# Patient Record
Sex: Male | Born: 1945 | Race: White | Hispanic: No | Marital: Married | Smoking: Never smoker
Health system: Southern US, Community
[De-identification: ages and names within clinical notes are randomized; demographics above are authoritative.]

## PROBLEM LIST (undated history)

## (undated) DIAGNOSIS — H269 Unspecified cataract: Secondary | ICD-10-CM

## (undated) DIAGNOSIS — M1990 Unspecified osteoarthritis, unspecified site: Secondary | ICD-10-CM

## (undated) DIAGNOSIS — K219 Gastro-esophageal reflux disease without esophagitis: Secondary | ICD-10-CM

## (undated) DIAGNOSIS — H3320 Serous retinal detachment, unspecified eye: Secondary | ICD-10-CM

## (undated) DIAGNOSIS — T7840XA Allergy, unspecified, initial encounter: Secondary | ICD-10-CM

## (undated) DIAGNOSIS — E785 Hyperlipidemia, unspecified: Secondary | ICD-10-CM

## (undated) DIAGNOSIS — H35729 Serous detachment of retinal pigment epithelium, unspecified eye: Secondary | ICD-10-CM

## (undated) DIAGNOSIS — Z9109 Other allergy status, other than to drugs and biological substances: Secondary | ICD-10-CM

## (undated) HISTORY — DX: Other allergy status, other than to drugs and biological substances: Z91.09

## (undated) HISTORY — DX: Hyperlipidemia, unspecified: E78.5

## (undated) HISTORY — PX: OTHER SURGICAL HISTORY: SHX169

## (undated) HISTORY — DX: Serous retinal detachment, unspecified eye: H33.20

## (undated) HISTORY — PX: VITRECTOMY: SHX106

## (undated) HISTORY — PX: INGUINAL HERNIA REPAIR: SUR1180

## (undated) HISTORY — DX: Serous detachment of retinal pigment epithelium, unspecified eye: H35.729

## (undated) HISTORY — DX: Unspecified cataract: H26.9

## (undated) HISTORY — PX: POLYPECTOMY: SHX149

## (undated) HISTORY — PX: COLONOSCOPY: SHX174

## (undated) HISTORY — DX: Unspecified osteoarthritis, unspecified site: M19.90

## (undated) HISTORY — DX: Allergy, unspecified, initial encounter: T78.40XA

## (undated) HISTORY — DX: Gastro-esophageal reflux disease without esophagitis: K21.9

---

## 2001-05-06 ENCOUNTER — Encounter: Payer: Self-pay | Admitting: Emergency Medicine

## 2001-05-06 ENCOUNTER — Emergency Department (HOSPITAL_COMMUNITY): Admission: EM | Admit: 2001-05-06 | Discharge: 2001-05-06 | Payer: Self-pay | Admitting: Emergency Medicine

## 2004-02-28 ENCOUNTER — Ambulatory Visit: Payer: Self-pay | Admitting: Internal Medicine

## 2004-03-05 ENCOUNTER — Ambulatory Visit: Payer: Self-pay | Admitting: Internal Medicine

## 2004-03-19 ENCOUNTER — Ambulatory Visit: Payer: Self-pay | Admitting: Internal Medicine

## 2004-04-23 ENCOUNTER — Ambulatory Visit: Payer: Self-pay | Admitting: Internal Medicine

## 2004-05-05 ENCOUNTER — Ambulatory Visit: Payer: Self-pay | Admitting: Internal Medicine

## 2004-07-14 ENCOUNTER — Ambulatory Visit: Payer: Self-pay | Admitting: Internal Medicine

## 2004-09-01 ENCOUNTER — Ambulatory Visit: Payer: Self-pay | Admitting: Internal Medicine

## 2004-09-09 ENCOUNTER — Ambulatory Visit: Payer: Self-pay | Admitting: Internal Medicine

## 2004-12-17 ENCOUNTER — Ambulatory Visit: Payer: Self-pay | Admitting: Internal Medicine

## 2005-01-12 ENCOUNTER — Ambulatory Visit: Payer: Self-pay | Admitting: Internal Medicine

## 2005-01-21 ENCOUNTER — Ambulatory Visit: Payer: Self-pay | Admitting: Internal Medicine

## 2005-02-03 ENCOUNTER — Ambulatory Visit: Payer: Self-pay | Admitting: Internal Medicine

## 2005-02-16 ENCOUNTER — Encounter (INDEPENDENT_AMBULATORY_CARE_PROVIDER_SITE_OTHER): Payer: Self-pay | Admitting: *Deleted

## 2005-02-16 ENCOUNTER — Ambulatory Visit: Payer: Self-pay | Admitting: Internal Medicine

## 2005-03-06 ENCOUNTER — Ambulatory Visit: Payer: Self-pay | Admitting: Internal Medicine

## 2005-04-09 ENCOUNTER — Ambulatory Visit: Payer: Self-pay | Admitting: Internal Medicine

## 2005-05-03 HISTORY — PX: KNEE ARTHROSCOPY: SUR90

## 2007-02-06 DIAGNOSIS — E785 Hyperlipidemia, unspecified: Secondary | ICD-10-CM

## 2007-02-06 HISTORY — DX: Hyperlipidemia, unspecified: E78.5

## 2007-05-17 ENCOUNTER — Ambulatory Visit: Payer: Self-pay | Admitting: Internal Medicine

## 2007-05-17 LAB — CONVERTED CEMR LAB
ALT: 27 units/L (ref 0–53)
AST: 21 units/L (ref 0–37)
Albumin: 4 g/dL (ref 3.5–5.2)
Alkaline Phosphatase: 44 units/L (ref 39–117)
BUN: 11 mg/dL (ref 6–23)
Basophils Absolute: 0 10*3/uL (ref 0.0–0.1)
Basophils Relative: 0.1 % (ref 0.0–1.0)
Bilirubin Urine: NEGATIVE
Bilirubin, Direct: 0.2 mg/dL (ref 0.0–0.3)
CO2: 29 meq/L (ref 19–32)
Calcium: 9.5 mg/dL (ref 8.4–10.5)
Chloride: 105 meq/L (ref 96–112)
Cholesterol: 169 mg/dL (ref 0–200)
Creatinine, Ser: 1 mg/dL (ref 0.4–1.5)
Eosinophils Absolute: 0.1 10*3/uL (ref 0.0–0.6)
Eosinophils Relative: 1.5 % (ref 0.0–5.0)
GFR calc Af Amer: 98 mL/min
GFR calc non Af Amer: 81 mL/min
Glucose, Bld: 89 mg/dL (ref 70–99)
Glucose, Urine, Semiquant: 100
HCT: 46.1 % (ref 39.0–52.0)
HDL: 45.1 mg/dL (ref >39.0)
Hemoglobin: 16 g/dL (ref 13.0–17.0)
Ketones, urine, test strip: NEGATIVE
LDL Cholesterol: 106 mg/dL — ABNORMAL HIGH (ref 0–99)
Lymphocytes Relative: 36.6 % (ref 12.0–46.0)
MCHC: 34.8 g/dL (ref 30.0–36.0)
MCV: 93.6 fL (ref 78.0–100.0)
Monocytes Absolute: 0.5 10*3/uL (ref 0.2–0.7)
Monocytes Relative: 8 % (ref 3.0–11.0)
Neutro Abs: 3.6 10*3/uL (ref 1.4–7.7)
Neutrophils Relative %: 53.8 % (ref 43.0–77.0)
Nitrite: NEGATIVE
PSA: 0.98 ng/mL (ref 0.10–4.00)
Platelets: 272 10*3/uL (ref 150–400)
Potassium: 4.2 meq/L (ref 3.5–5.1)
Protein, U semiquant: NEGATIVE
RBC: 4.93 M/uL (ref 4.22–5.81)
RDW: 12.5 % (ref 11.5–14.6)
Sodium: 140 meq/L (ref 135–145)
Specific Gravity, Urine: 1.02
TSH: 1.36 microintl units/mL (ref 0.35–5.50)
Total Bilirubin: 1 mg/dL (ref 0.3–1.2)
Total CHOL/HDL Ratio: 3.7
Total Protein: 6.6 g/dL (ref 6.0–8.3)
Triglycerides: 88 mg/dL (ref 0–149)
Urobilinogen, UA: 0.2
VLDL: 18 mg/dL (ref 0–40)
WBC Urine, dipstick: NEGATIVE
WBC: 6.6 10*3/uL (ref 4.5–10.5)
pH: 5.5

## 2007-05-24 ENCOUNTER — Ambulatory Visit: Payer: Self-pay | Admitting: Internal Medicine

## 2008-02-01 ENCOUNTER — Ambulatory Visit: Payer: Self-pay | Admitting: Internal Medicine

## 2008-04-16 ENCOUNTER — Ambulatory Visit: Payer: Self-pay | Admitting: Internal Medicine

## 2008-08-28 ENCOUNTER — Telehealth: Payer: Self-pay | Admitting: Internal Medicine

## 2008-08-29 ENCOUNTER — Ambulatory Visit: Payer: Self-pay | Admitting: Internal Medicine

## 2008-08-29 LAB — CONVERTED CEMR LAB
Cholesterol: 173 mg/dL (ref 0–200)
HDL: 48.8 mg/dL (ref >39.00)
LDL Cholesterol: 99 mg/dL (ref 0–99)
Total CHOL/HDL Ratio: 4
Triglycerides: 125 mg/dL (ref 0.0–149.0)
VLDL: 25 mg/dL (ref 0.0–40.0)

## 2008-10-07 ENCOUNTER — Ambulatory Visit: Payer: Self-pay | Admitting: Internal Medicine

## 2009-04-04 ENCOUNTER — Ambulatory Visit: Payer: Self-pay | Admitting: Internal Medicine

## 2009-04-04 LAB — CONVERTED CEMR LAB
ALT: 31 units/L (ref 0–53)
AST: 26 units/L (ref 0–37)
Albumin: 3.9 g/dL (ref 3.5–5.2)
Alkaline Phosphatase: 37 units/L — ABNORMAL LOW (ref 39–117)
BUN: 9 mg/dL (ref 6–23)
Basophils Absolute: 0 10*3/uL (ref 0.0–0.1)
Basophils Relative: 0.3 % (ref 0.0–3.0)
Bilirubin Urine: NEGATIVE
Bilirubin, Direct: 0.1 mg/dL (ref 0.0–0.3)
Blood in Urine, dipstick: NEGATIVE
CO2: 29 meq/L (ref 19–32)
Calcium: 9.1 mg/dL (ref 8.4–10.5)
Chloride: 108 meq/L (ref 96–112)
Cholesterol: 175 mg/dL (ref 0–200)
Creatinine, Ser: 1 mg/dL (ref 0.4–1.5)
Eosinophils Absolute: 0.1 10*3/uL (ref 0.0–0.7)
Eosinophils Relative: 1.2 % (ref 0.0–5.0)
GFR calc non Af Amer: 80.55 mL/min (ref >60)
Glucose, Bld: 94 mg/dL (ref 70–99)
Glucose, Urine, Semiquant: NEGATIVE
HCT: 45.4 % (ref 39.0–52.0)
HDL: 50 mg/dL (ref >39.00)
Hemoglobin: 15 g/dL (ref 13.0–17.0)
Ketones, urine, test strip: NEGATIVE
LDL Cholesterol: 108 mg/dL — ABNORMAL HIGH (ref 0–99)
Lymphocytes Relative: 34.5 % (ref 12.0–46.0)
Lymphs Abs: 2.3 10*3/uL (ref 0.7–4.0)
MCHC: 33.1 g/dL (ref 30.0–36.0)
MCV: 98 fL (ref 78.0–100.0)
Monocytes Absolute: 0.6 10*3/uL (ref 0.1–1.0)
Monocytes Relative: 9.1 % (ref 3.0–12.0)
Neutro Abs: 3.6 10*3/uL (ref 1.4–7.7)
Neutrophils Relative %: 54.9 % (ref 43.0–77.0)
Nitrite: NEGATIVE
PSA: 0.73 ng/mL (ref 0.10–4.00)
Platelets: 213 10*3/uL (ref 150.0–400.0)
Potassium: 4.2 meq/L (ref 3.5–5.1)
Protein, U semiquant: NEGATIVE
RBC: 4.63 M/uL (ref 4.22–5.81)
RDW: 12.5 % (ref 11.5–14.6)
Sodium: 143 meq/L (ref 135–145)
Specific Gravity, Urine: 1.015
TSH: 1.04 microintl units/mL (ref 0.35–5.50)
Total Bilirubin: 1 mg/dL (ref 0.3–1.2)
Total CHOL/HDL Ratio: 4
Total Protein: 6.6 g/dL (ref 6.0–8.3)
Triglycerides: 83 mg/dL (ref 0.0–149.0)
Urobilinogen, UA: 0.2
VLDL: 16.6 mg/dL (ref 0.0–40.0)
WBC Urine, dipstick: NEGATIVE
WBC: 6.6 10*3/uL (ref 4.5–10.5)
pH: 5

## 2009-04-11 ENCOUNTER — Ambulatory Visit: Payer: Self-pay | Admitting: Internal Medicine

## 2009-05-15 ENCOUNTER — Ambulatory Visit: Payer: Self-pay | Admitting: Family Medicine

## 2009-05-15 DIAGNOSIS — J209 Acute bronchitis, unspecified: Secondary | ICD-10-CM | POA: Insufficient documentation

## 2009-05-16 ENCOUNTER — Telehealth: Payer: Self-pay | Admitting: Internal Medicine

## 2010-01-02 ENCOUNTER — Encounter: Payer: Self-pay | Admitting: Internal Medicine

## 2010-06-02 NOTE — Letter (Signed)
Summary: Colonoscopy Letter  [facility]  [address] [place], [place] [address]   Phone: [phone]  Fax: [phone]      January 02, 2010 MRN: [id number]   [facility] [address] [place], [place]  [address]   Dear Mr. Brett Ross,   According to your medical record, it is time for you to schedule a Colonoscopy. The American Cancer Society recommends this procedure as a method to detect early colon cancer. Patients with a family history of colon cancer, or a personal history of colon polyps or inflammatory bowel disease are at increased risk.  This letter has been generated based on the recommendations made at the time of your procedure. If you feel that in your particular situation this may no longer apply, please contact our office.  Please call our office at [phone] to schedule this appointment or to update your records at your earliest convenience.  Thank you for cooperating with [place] to provide you with the very best care possible.   Sincerely,  Wilhemina Bonito. Marina Goodell, M.D.  [facility] [phone]

## 2010-06-02 NOTE — Progress Notes (Signed)
Summary: need alternative  Phone Note Call from Patient Call back at Home Phone [phone]   Caller: spouse- robin Summary of Call: pt was rx'd cough syrup yeasterday. he allergic to codiene. please rx something else. Call [facility] on [address]-----[phone]. Initial call taken by: Warnell Forester,  May 16, 2009 10:24 AM  Follow-up for Phone Call        can try robitussin dm Follow-up by: Birdie Sons MD,  May 16, 2009 2:51 PM  Additional Follow-up for Phone Call Additional follow up Details #1::        Phone Call Completed Additional Follow-up by: Rudy Jew, RN,  May 16, 2009 3:05 PM

## 2010-06-02 NOTE — Assessment & Plan Note (Signed)
Summary: COUGH, CONGESTION // RS   Vital Signs:  Patient profile:   65 year old male Temp:     97.6 degrees F oral BP sitting:   140 / 82  (left arm) Cuff size:   regular  Vitals Entered By: Sid Falcon LPN (May 15, 2009 9:29 AM) CC: Cough, congestion X 5 days   History of Present Illness: Acute visit. 5-6 day history of chest congestion which has been progressive. Cough productive of green to brown sputum. Night sweats and possible low-grade fever. Diffuse body aches. Moderate nasal congestion. Advil and Mucinex DM with minimal relief. Denies any sore throat, nausea, vomiting, or diarrhea. Nonsmoker.  Allergies: 1)  ! Caffeine  Past History:  Past Medical History: Last updated: 02/06/2007 Hyperlipidemia  Review of Systems      See HPI  Physical Exam  General:  Well-developed,well-nourished,in no acute distress; alert,appropriate and cooperative throughout examination Ears:  External ear exam shows no significant lesions or deformities.  Otoscopic examination reveals clear canals, tympanic membranes are intact bilaterally without bulging, retraction, inflammation or discharge. Hearing is grossly normal bilaterally. Nose:  External nasal examination shows no deformity or inflammation. Nasal mucosa are pink and moist without lesions or exudates. Mouth:  Oral mucosa and oropharynx without lesions or exudates.  Teeth in good repair. Neck:  No deformities, masses, or tenderness noted. Lungs:  Normal respiratory effort, chest expands symmetrically. Lungs are clear to auscultation, no crackles or wheezes. Heart:  Normal rate and regular rhythm. S1 and S2 normal without gallop, murmur, click, rub or other extra sounds.   Impression & Recommendations:  Problem # 1:  ACUTE BRONCHITIS (ICD-466.0)  His updated medication list for this problem includes:    Azithromycin 250 Mg Tabs (Azithromycin) .Marland Kitchen... 2 by mouth today then one by mouth once daily for 4 days  Hydrocodone-homatropine 5-1.5 Mg/53ml Syrp (Hydrocodone-homatropine) ..... One tsp by mouth q 4-6 hours as needed cough  Complete Medication List: 1)  Lipitor 10 Mg Tabs (Atorvastatin calcium) .... Take 1 tablet by mouth once a day 2)  Adult Aspirin Low Strength 81 Mg Tbdp (Aspirin) 3)  Fluticasone Propionate 50 Mcg/act Susp (Fluticasone propionate) .... One spray each nostril daily as needed 4)  Salmon Oil-1000 200 Mg Caps (Omega-3 fatty acids) .... 2 daily 5)  Centrum Silver Tabs (Multiple vitamins-minerals) .... Once daily 6)  Caltrate 600+d 600-400 Mg-unit Tabs (Calcium carbonate-vitamin d) .... Once daily 7)  Fexofenadine Hcl 60 Mg Tabs (Fexofenadine hcl) .Marland Kitchen.. 1 by mouth 2 times daily as needed for allergies 8)  Azithromycin 250 Mg Tabs (Azithromycin) .... 2 by mouth today then one by mouth once daily for 4 days 9)  Hydrocodone-homatropine 5-1.5 Mg/70ml Syrp (Hydrocodone-homatropine) .... One tsp by mouth q 4-6 hours as needed cough  Patient Instructions: 1)  Acute Bronchitis symptoms for less then 10 days are not  helped by antibiotics. Take over the counter cough medications. Call if no improvement in 5-7 days, sooner if increasing cough, fever, or new symptoms ( shortness of breath, chest pain) .  Prescriptions: HYDROCODONE-HOMATROPINE 5-1.5 MG/5ML SYRP (HYDROCODONE-HOMATROPINE) one tsp by mouth q 4-6 hours as needed cough  #120 ml x 0   Entered and Authorized by:   Evelena Peat MD   Signed by:   Evelena Peat MD on 05/15/2009   Method used:   Print then Give to Patient   RxID:   [id number] AZITHROMYCIN 250 MG TABS (AZITHROMYCIN) 2 by mouth today then one by mouth once daily for 4 days  #  6 x 0   Entered and Authorized by:   Evelena Peat MD   Signed by:   Evelena Peat MD on 05/15/2009   Method used:   Electronically to        [facility]. [phone]* (retail)       [phone]       [place], [place]  [address]       Ph:  [phone]       Fax: [phone]   RxID:   [id number]

## 2010-06-08 ENCOUNTER — Encounter: Payer: Self-pay | Admitting: Internal Medicine

## 2010-06-08 ENCOUNTER — Ambulatory Visit (INDEPENDENT_AMBULATORY_CARE_PROVIDER_SITE_OTHER): Payer: BC Managed Care – PPO | Admitting: Internal Medicine

## 2010-06-08 VITALS — BP 132/80 | HR 58 | Temp 97.7°F | Ht 66.5 in | Wt 171.0 lb

## 2010-06-08 DIAGNOSIS — Z0000 Encounter for general adult medical examination without abnormal findings: Secondary | ICD-10-CM

## 2010-06-08 DIAGNOSIS — J209 Acute bronchitis, unspecified: Secondary | ICD-10-CM

## 2010-06-08 DIAGNOSIS — R002 Palpitations: Secondary | ICD-10-CM

## 2010-06-08 NOTE — Progress Notes (Signed)
  Subjective:    Patient ID: Brett Ross, male    DOB: July 13, 1945, 65 y.o.   MRN: [id number]  HPI  5 days ago he woke up in the middle of night with unusual palpitations he felt in his chest. No CP, SOB, PND. No recurrent sxs sxs lasted for approximately three minutes.  No other significant sxs  Review of Systems  patient denies chest pain, shortness of breath, orthopnea. Denies lower extremity edema, abdominal pain, change in appetite, change in bowel movements. Patient denies rashes, musculoskeletal complaints. No other specific complaints in a complete review of systems.     Objective:   Physical Exam       well-developed well-nourished male in no acute distress. HEENT exam atraumatic, normocephalic, neck supple without jugular venous distention. Chest clear to auscultation cardiac exam S1-S2 are regular. Abdominal exam overweight with bowel sounds, soft and nontender. Extremities no edema. Neurologic exam is alert with a normal gait.   Assessment & Plan:   patient presents with palpitations. He had one episode. EKG today is unremarkable. He describes palpitations am not concerned. He doesn't have any associated symptoms. I think it would be best to following his condition. If he has recurrent symptoms or more frequent  Symptoms that might warrant further evaluation. Patient understands the workup and evaluation. All his questions were answered.

## 2010-06-09 LAB — CBC WITH DIFFERENTIAL/PLATELET
Basophils Relative: 0.4 % (ref 0.0–3.0)
Eosinophils Relative: 1.5 % (ref 0.0–5.0)
HCT: 43.8 % (ref 39.0–52.0)
Lymphs Abs: 3 10*3/uL (ref 0.7–4.0)
MCV: 95 fl (ref 78.0–100.0)
Monocytes Absolute: 0.7 10*3/uL (ref 0.1–1.0)
Monocytes Relative: 8.2 % (ref 3.0–12.0)
RBC: 4.61 Mil/uL (ref 4.22–5.81)
WBC: 8.2 10*3/uL (ref 4.5–10.5)

## 2010-06-09 LAB — TSH: TSH: 0.22 u[IU]/mL — ABNORMAL LOW (ref 0.35–5.50)

## 2010-07-15 ENCOUNTER — Other Ambulatory Visit (INDEPENDENT_AMBULATORY_CARE_PROVIDER_SITE_OTHER): Payer: BC Managed Care – PPO

## 2010-07-15 DIAGNOSIS — E039 Hypothyroidism, unspecified: Secondary | ICD-10-CM

## 2010-07-15 LAB — TSH: TSH: 1.45 u[IU]/mL (ref 0.35–5.50)

## 2010-07-15 LAB — T3, FREE: T3, Free: 2.7 pg/mL (ref 2.3–4.2)

## 2010-07-17 ENCOUNTER — Other Ambulatory Visit: Payer: Self-pay | Admitting: Internal Medicine

## 2010-07-28 ENCOUNTER — Encounter: Payer: Self-pay | Admitting: Internal Medicine

## 2011-02-03 ENCOUNTER — Encounter: Payer: Self-pay | Admitting: Internal Medicine

## 2011-02-17 ENCOUNTER — Encounter: Payer: Self-pay | Admitting: Internal Medicine

## 2011-02-17 ENCOUNTER — Ambulatory Visit (AMBULATORY_SURGERY_CENTER): Payer: BC Managed Care – PPO | Admitting: *Deleted

## 2011-02-17 VITALS — Ht 66.0 in | Wt 171.0 lb

## 2011-02-17 DIAGNOSIS — Z1211 Encounter for screening for malignant neoplasm of colon: Secondary | ICD-10-CM

## 2011-02-17 MED ORDER — PEG-KCL-NACL-NASULF-NA ASC-C 100 G PO SOLR: ORAL | Status: DC

## 2011-03-03 ENCOUNTER — Ambulatory Visit (AMBULATORY_SURGERY_CENTER): Payer: BC Managed Care – PPO | Admitting: Internal Medicine

## 2011-03-03 ENCOUNTER — Encounter: Payer: Self-pay | Admitting: Internal Medicine

## 2011-03-03 VITALS — HR 54 | Temp 97.5°F | Resp 20 | Ht 66.0 in | Wt 171.0 lb

## 2011-03-03 DIAGNOSIS — Z860100 Personal history of colon polyps, unspecified: Secondary | ICD-10-CM

## 2011-03-03 DIAGNOSIS — K5730 Diverticulosis of large intestine without perforation or abscess without bleeding: Secondary | ICD-10-CM

## 2011-03-03 DIAGNOSIS — Z1211 Encounter for screening for malignant neoplasm of colon: Secondary | ICD-10-CM

## 2011-03-03 MED ORDER — SODIUM CHLORIDE 0.9 % IV SOLN: 500.0000 mL | INTRAVENOUS | Status: DC

## 2011-03-03 NOTE — Patient Instructions (Signed)
FOLLOW YOUR DISCHARGE INSTRUCTIONS ON THE GREEN AND BLUE INSTRUCTION SHEETS.  CONTINUEYOUR MEDICATIONS.  HIGH FIBER  DIET.  NEXT COLONOSCOPY IN 10 YEARS.

## 2011-03-04 ENCOUNTER — Telehealth: Payer: Self-pay

## 2011-03-04 NOTE — Telephone Encounter (Signed)

## 2011-03-23 ENCOUNTER — Other Ambulatory Visit: Payer: Self-pay | Admitting: Internal Medicine

## 2011-05-06 ENCOUNTER — Other Ambulatory Visit: Payer: Self-pay | Admitting: Internal Medicine

## 2011-05-06 NOTE — Telephone Encounter (Signed)
Pt need new rx atorvastatin 10mg  #90 with 3 refills [facility] [phone].

## 2011-05-07 NOTE — Telephone Encounter (Signed)
Pt needs OV 

## 2011-05-07 NOTE — Telephone Encounter (Signed)
-  lmom at cell # [phone]

## 2011-05-10 NOTE — Telephone Encounter (Signed)
Pt would like to have labs in advance. Can I sch?

## 2011-05-11 NOTE — Telephone Encounter (Signed)
Pt is sch for cpx 05-27-11 815am

## 2011-05-11 NOTE — Telephone Encounter (Signed)
Schedule for cpx with labs

## 2011-05-20 ENCOUNTER — Other Ambulatory Visit: Payer: BC Managed Care – PPO

## 2011-05-24 ENCOUNTER — Other Ambulatory Visit: Payer: Self-pay | Admitting: Internal Medicine

## 2011-05-27 ENCOUNTER — Encounter: Payer: BC Managed Care – PPO | Admitting: Internal Medicine

## 2011-06-03 ENCOUNTER — Other Ambulatory Visit (INDEPENDENT_AMBULATORY_CARE_PROVIDER_SITE_OTHER): Payer: BC Managed Care – PPO

## 2011-06-03 DIAGNOSIS — Z0000 Encounter for general adult medical examination without abnormal findings: Secondary | ICD-10-CM

## 2011-06-03 LAB — HEPATIC FUNCTION PANEL
ALT: 20 U/L (ref 0–53)
AST: 21 U/L (ref 0–37)
Total Protein: 6.6 g/dL (ref 6.0–8.3)

## 2011-06-03 LAB — POCT URINALYSIS DIPSTICK
Glucose, UA: NEGATIVE
Ketones, UA: NEGATIVE
Leukocytes, UA: NEGATIVE
Spec Grav, UA: 1.02
Urobilinogen, UA: 0.2

## 2011-06-03 LAB — LIPID PANEL
Cholesterol: 159 mg/dL (ref 0–200)
LDL Cholesterol: 94 mg/dL (ref 0–99)

## 2011-06-03 LAB — PSA: PSA: 0.73 ng/mL (ref 0.10–4.00)

## 2011-06-03 LAB — CBC WITH DIFFERENTIAL/PLATELET
Basophils Relative: 0.3 % (ref 0.0–3.0)
Eosinophils Relative: 1 % (ref 0.0–5.0)
HCT: 43.3 % (ref 39.0–52.0)
Hemoglobin: 14.9 g/dL (ref 13.0–17.0)
Lymphs Abs: 2.1 10*3/uL (ref 0.7–4.0)
MCV: 95.2 fl (ref 78.0–100.0)
Monocytes Absolute: 0.5 10*3/uL (ref 0.1–1.0)
RBC: 4.55 Mil/uL (ref 4.22–5.81)
WBC: 6 10*3/uL (ref 4.5–10.5)

## 2011-06-03 LAB — TSH: TSH: 1.12 u[IU]/mL (ref 0.35–5.50)

## 2011-06-03 LAB — BASIC METABOLIC PANEL
Chloride: 105 mEq/L (ref 96–112)
Potassium: 4.3 mEq/L (ref 3.5–5.1)
Sodium: 141 mEq/L (ref 135–145)

## 2011-06-10 ENCOUNTER — Encounter: Payer: BC Managed Care – PPO | Admitting: Internal Medicine

## 2011-06-11 ENCOUNTER — Encounter: Payer: Self-pay | Admitting: Internal Medicine

## 2011-06-11 ENCOUNTER — Ambulatory Visit (INDEPENDENT_AMBULATORY_CARE_PROVIDER_SITE_OTHER): Payer: BC Managed Care – PPO | Admitting: Internal Medicine

## 2011-06-11 VITALS — BP 102/76 | HR 72 | Temp 98.1°F | Ht 66.0 in | Wt 170.0 lb

## 2011-06-11 DIAGNOSIS — Z0000 Encounter for general adult medical examination without abnormal findings: Secondary | ICD-10-CM

## 2011-06-11 DIAGNOSIS — M79609 Pain in unspecified limb: Secondary | ICD-10-CM

## 2011-06-11 DIAGNOSIS — Z23 Encounter for immunization: Secondary | ICD-10-CM

## 2011-06-11 DIAGNOSIS — M79673 Pain in unspecified foot: Secondary | ICD-10-CM

## 2011-06-11 MED ORDER — ATORVASTATIN CALCIUM 10 MG PO TABS: 10.0000 mg | ORAL_TABLET | Freq: Every day | ORAL | Status: DC

## 2011-06-11 NOTE — Progress Notes (Signed)
  Subjective:    Patient ID: KNOWLEDGE ESCANDON, male    DOB: 08/03/1945, 66 y.o.   MRN: [id number]  HPI  cpx  Bilateral heel pain--can last all day but definitely first thing in a.m. Using arch supports Occasionally notes back pain-typically with certain movements Questions about shingles immunization  Past Medical History  Diagnosis Date  . HYPERLIPIDEMIA 02/06/2007  . Environmental allergies   . Arthritis    Past Surgical History  Procedure Date  . Knee arthroscopy 2007    left    reports that he has never smoked. He does not have any smokeless tobacco history on file. He reports that he drinks about 4.2 ounces of alcohol per week. He reports that he does not use illicit drugs. family history includes Heart disease (age of onset:60) in his brother; Heart disease (age of onset:66) in his brother; Heart failure in his mother; Leukemia in his father; and Melanoma in his brother.  There is no history of Colon cancer, and Rectal cancer, and Stomach cancer, . Allergies  Allergen Reactions  . Caffeine     REACTION: tinnitis     Review of Systems  patient denies chest pain, shortness of breath, orthopnea. Denies lower extremity edema, abdominal pain, change in appetite, change in bowel movements. Patient denies rashes, musculoskeletal complaints. No other specific complaints in a complete review of systems.      Objective:   Physical Exam Well-developed male in no acute distress. HEENT exam atraumatic, normocephalic, extraocular muscles are intact. Conjunctivae are pink without exudate. Neck is supple without lymphadenopathy, thyromegaly, jugular venous distention. Chest is clear to auscultation without increased work of breathing. Cardiac exam S1-S2 are regular. The PMI is normal. No significant murmurs or gallops. Abdominal exam active bowel sounds, soft, nontender. No abdominal bruits. Extremities no clubbing cyanosis or edema. Peripheral pulses are normal without bruits. Neurologic  exam alert and oriented without any motor or sensory deficits. Rectal exam normal tone prostate normal size without masses or asymmetry.        Assessment & Plan:

## 2011-12-30 ENCOUNTER — Ambulatory Visit (INDEPENDENT_AMBULATORY_CARE_PROVIDER_SITE_OTHER): Payer: BC Managed Care – PPO | Admitting: Internal Medicine

## 2011-12-30 ENCOUNTER — Encounter: Payer: Self-pay | Admitting: Internal Medicine

## 2011-12-30 VITALS — BP 100/70 | Temp 97.8°F | Wt 171.0 lb

## 2011-12-30 DIAGNOSIS — J069 Acute upper respiratory infection, unspecified: Secondary | ICD-10-CM

## 2011-12-30 NOTE — Progress Notes (Signed)
Subjective:    Patient ID: Brett Ross, male    DOB: 11-18-1945, 66 y.o.   MRN: [id number]  HPI  66 year old patient who presents with a four-day history of illness. He describes sore throat headache postnasal drip and a general sense of unwellness. He also describes some nonspecific GI queasiness. No change in his bowel habits as far as frequency but stools have been a bit looser. No nausea or vomiting. No documented fever. Medical regimen includes the Flonase and Zyrtec.  Past Medical History  Diagnosis Date  . HYPERLIPIDEMIA 02/06/2007  . Environmental allergies   . Arthritis     History   Social History  . Marital Status: Married    Spouse Name: N/A    Number of Children: N/A  . Years of Education: N/A   Occupational History  . Not on file.   Social History Main Topics  . Smoking status: Never Smoker   . Smokeless tobacco: Not on file  . Alcohol Use: 4.2 oz/week    7 Cans of beer per week  . Drug Use: No  . Sexually Active: Not on file   Other Topics Concern  . Not on file   Social History Narrative  . No narrative on file    Past Surgical History  Procedure Date  . Knee arthroscopy 2007    left    Family History  Problem Relation Age of Onset  . Heart failure Mother   . Leukemia Father   . Colon cancer Neg Hx   . Rectal cancer Neg Hx   . Stomach cancer Neg Hx   . Melanoma Brother   . Heart disease Brother 56    mi, stent  . Heart disease Brother 44    MI-required stent    Allergies  Allergen Reactions  . Caffeine     REACTION: tinnitis    Current Outpatient Prescriptions on File Prior to Visit  Medication Sig Dispense Refill  . aspirin 81 MG tablet Take 81 mg by mouth daily.        Marland Kitchen atorvastatin (LIPITOR) 10 MG tablet Take 1 tablet (10 mg total) by mouth daily.  90 tablet  3  . Calcium Carbonate-Vit D-Min (CALTRATE 600+D PLUS) 600-400 MG-UNIT per tablet Chew 1 tablet by mouth daily.        . Multiple Vitamins-Minerals (CENTRUM SILVER  PO) Take by mouth daily.        . Omega-3 Fatty Acids (SALMON OIL-1000) 200 MG CAPS Take by mouth daily.        . fluticasone (FLONASE) 50 MCG/ACT nasal spray 2 sprays by Nasal route daily.          BP 100/70  Temp 97.8 F (36.6 C) (Oral)  Wt 171 lb (77.565 kg)       Review of Systems  Constitutional: Positive for fatigue. Negative for fever, chills and appetite change.  HENT: Positive for congestion, sore throat, rhinorrhea and postnasal drip. Negative for hearing loss, ear pain, trouble swallowing, neck stiffness, dental problem, voice change and tinnitus.   Eyes: Negative for pain, discharge and visual disturbance.  Respiratory: Negative for cough, chest tightness, wheezing and stridor.   Cardiovascular: Negative for chest pain, palpitations and leg swelling.  Gastrointestinal: Negative for nausea, vomiting, abdominal pain, diarrhea, constipation, blood in stool and abdominal distention.  Genitourinary: Negative for urgency, hematuria, flank pain, discharge, difficulty urinating and genital sores.  Musculoskeletal: Negative for myalgias, back pain, joint swelling, arthralgias and gait problem.  Skin: Negative for rash.  Neurological: Negative for dizziness, syncope, speech difficulty, weakness, numbness and headaches.  Hematological: Negative for adenopathy. Does not bruise/bleed easily.  Psychiatric/Behavioral: Negative for behavioral problems and dysphoric mood. The patient is not nervous/anxious.        Objective:   Physical Exam  Constitutional: He is oriented to person, place, and time. He appears well-developed and well-nourished.  HENT:  Head: Normocephalic.  Right Ear: External ear normal.  Left Ear: External ear normal.       Oropharynx is minimally injected Pharyngeal crowding noted  Eyes: Conjunctivae and EOM are normal.  Neck: Normal range of motion.  Cardiovascular: Normal rate and normal heart sounds.   Pulmonary/Chest: Breath sounds normal.  Abdominal:  Soft. Bowel sounds are normal. He exhibits no distension. There is no tenderness. There is no rebound.  Musculoskeletal: Normal range of motion. He exhibits no edema and no tenderness.  Lymphadenopathy:    He has no cervical adenopathy.  Neurological: He is alert and oriented to person, place, and time.  Psychiatric: He has a normal mood and affect. His behavior is normal.          Assessment & Plan:   Viral syndrome. We'll continue Zyrtec and fluticasone. We'll treat with the Naprelan rest

## 2011-12-30 NOTE — Patient Instructions (Signed)
Get plenty of rest, Drink lots of  clear liquids, and use Tylenol or Naprelan  for fever and discomfort.    Continue Zyrtec and fluticasone/Nasonex  Call or return to clinic prn if these symptoms worsen or fail to improve as anticipated.

## 2012-01-12 ENCOUNTER — Telehealth: Payer: Self-pay | Admitting: Internal Medicine

## 2012-01-12 MED ORDER — FLUTICASONE PROPIONATE 50 MCG/ACT NA SUSP: 2.0000 | Freq: Every day | NASAL | Status: DC

## 2012-01-12 NOTE — Telephone Encounter (Signed)
Rx sent to local pharmacy and mail order as requested.

## 2012-01-12 NOTE — Telephone Encounter (Signed)
Caller: [name]/Patient; Phone: [phone]; Reason for Call: Caller: [name]/Patient; Patient Name: Brett Ross; PCP: Birdie Sons (Adults only); [facility] Callback Phone Number: [phone]  Patient states he was seen in the office by Dr.  Amador Cunas 12/30/11.  States a prescription for Flonase/Nasonex was to be sent to Pharmacy.  Patient states prescription order was not received by [facility] or [facility] in [place].  RN reviewed Epic, Electronic Health Record.  RN noted documentation that patient was to continue Flonase/Nasonex, but Pharmacy order not noted.   PATIENT REQUESTS THAT PRESCRIPTION FOR FLONASE OR NASONEX BE SENT TO [facility] IN [place] AT [phone] AND ADDITIONAL REFILLS TO BE SENT TO [facility] AT FAX # [phone].  PATIENT CAN BE REACHED AT [phone].

## 2012-01-12 NOTE — Telephone Encounter (Signed)
Pt is aware.  

## 2012-01-31 ENCOUNTER — Ambulatory Visit (INDEPENDENT_AMBULATORY_CARE_PROVIDER_SITE_OTHER): Payer: BC Managed Care – PPO | Admitting: Family

## 2012-01-31 ENCOUNTER — Encounter: Payer: Self-pay | Admitting: Family

## 2012-01-31 VITALS — BP 130/70 | HR 88 | Temp 98.0°F | Wt 170.0 lb

## 2012-01-31 DIAGNOSIS — R05 Cough: Secondary | ICD-10-CM

## 2012-01-31 DIAGNOSIS — J069 Acute upper respiratory infection, unspecified: Secondary | ICD-10-CM

## 2012-01-31 MED ORDER — AMOXICILLIN-POT CLAVULANATE 875-125 MG PO TABS: 1.0000 | ORAL_TABLET | Freq: Two times a day (BID) | ORAL | Status: DC

## 2012-01-31 MED ORDER — METHYLPREDNISOLONE ACETATE 40 MG/ML IJ SUSP
80.0000 mg | Freq: Once | INTRAMUSCULAR | Status: AC
  Administered 2012-01-31: 80 mg via INTRAMUSCULAR

## 2012-01-31 NOTE — Progress Notes (Signed)
Subjective:    Patient ID: Brett Ross, male    DOB: April 26, 1946, 66 y.o.   MRN: [id number]  URI  This is a recurrent problem. The current episode started 1 to 4 weeks ago. The problem has been gradually worsening. The maximum temperature recorded prior to his arrival was 100 - 100.9 F. The fever has been present for 1 to 2 days. Associated symptoms include congestion, coughing, sinus pain, sneezing and a sore throat. He has tried antihistamine and decongestant (Nasonex) for the symptoms. The treatment provided moderate relief.      Review of Systems  Constitutional: Negative.   HENT: Positive for congestion, sore throat and sneezing.   Respiratory: Positive for cough.   Cardiovascular: Negative.   Skin: Negative.   Hematological: Negative.   Psychiatric/Behavioral: Negative.    Past Medical History  Diagnosis Date  . HYPERLIPIDEMIA 02/06/2007  . Environmental allergies   . Arthritis     History   Social History  . Marital Status: Married    Spouse Name: N/A    Number of Children: N/A  . Years of Education: N/A   Occupational History  . Not on file.   Social History Main Topics  . Smoking status: Never Smoker   . Smokeless tobacco: Not on file  . Alcohol Use: 4.2 oz/week    7 Cans of beer per week  . Drug Use: No  . Sexually Active: Not on file   Other Topics Concern  . Not on file   Social History Narrative  . No narrative on file    Past Surgical History  Procedure Date  . Knee arthroscopy 2007    left    Family History  Problem Relation Age of Onset  . Heart failure Mother   . Leukemia Father   . Colon cancer Neg Hx   . Rectal cancer Neg Hx   . Stomach cancer Neg Hx   . Melanoma Brother   . Heart disease Brother 22    mi, stent  . Heart disease Brother 96    MI-required stent    Allergies  Allergen Reactions  . Caffeine     REACTION: tinnitis    Current Outpatient Prescriptions on File Prior to Visit  Medication Sig Dispense  Refill  . aspirin 81 MG tablet Take 81 mg by mouth daily.        Marland Kitchen atorvastatin (LIPITOR) 10 MG tablet Take 1 tablet (10 mg total) by mouth daily.  90 tablet  3  . Calcium Carbonate-Vit D-Min (CALTRATE 600+D PLUS) 600-400 MG-UNIT per tablet Chew 1 tablet by mouth daily.        . fluticasone (FLONASE) 50 MCG/ACT nasal spray Place 2 sprays into the nose daily.  48 g  0  . Multiple Vitamins-Minerals (CENTRUM SILVER PO) Take by mouth daily.        . Omega-3 Fatty Acids (SALMON OIL-1000) 200 MG CAPS Take by mouth daily.         No current facility-administered medications on file prior to visit.    BP 130/70  Pulse 88  Temp 98 F (36.7 C) (Oral)  Wt 170 lb (77.111 kg)  SpO2 98%chart    Objective:   Physical Exam  Constitutional: He is oriented to person, place, and time. He appears well-developed and well-nourished.  HENT:  Right Ear: External ear normal.  Left Ear: External ear normal.  Nose: Nose normal.  Mouth/Throat: Oropharynx is clear and moist.  Neck: Normal range of motion. Neck supple.  Cardiovascular: Normal rate, regular rhythm and normal heart sounds.   Pulmonary/Chest: Effort normal and breath sounds normal.  Abdominal: Soft. Bowel sounds are normal.  Musculoskeletal: Normal range of motion.  Neurological: He is alert and oriented to person, place, and time.  Skin: Skin is warm and dry.  Psychiatric: He has a normal mood and affect.      Depo Medrol 80mg  IM x 1    Assessment & Plan:  Assessment: Acute Bronchitis, Cough  Plan: Meds as directed. Call the office if symptoms worsen or persist. Recheck as scheduled and as needed sooner.

## 2012-05-04 ENCOUNTER — Other Ambulatory Visit: Payer: Self-pay | Admitting: *Deleted

## 2012-05-04 MED ORDER — ATORVASTATIN CALCIUM 10 MG PO TABS: 10.0000 mg | ORAL_TABLET | Freq: Every day | ORAL | Status: DC

## 2012-07-21 ENCOUNTER — Telehealth: Payer: Self-pay | Admitting: Internal Medicine

## 2012-07-21 DIAGNOSIS — E785 Hyperlipidemia, unspecified: Secondary | ICD-10-CM

## 2012-07-21 MED ORDER — ATORVASTATIN CALCIUM 10 MG PO TABS: 10.0000 mg | ORAL_TABLET | Freq: Every day | ORAL | Status: DC

## 2012-07-21 NOTE — Telephone Encounter (Signed)
Pt is sch for follow on med on 09-18-12. Pt is requested #90 lipitor 10 mg [facility]

## 2012-07-21 NOTE — Telephone Encounter (Signed)
rx sent in electronically 

## 2012-09-18 ENCOUNTER — Encounter: Payer: Self-pay | Admitting: Internal Medicine

## 2012-09-18 ENCOUNTER — Ambulatory Visit (INDEPENDENT_AMBULATORY_CARE_PROVIDER_SITE_OTHER): Payer: BC Managed Care – PPO | Admitting: Internal Medicine

## 2012-09-18 VITALS — BP 108/80 | HR 64 | Temp 97.8°F | Ht 66.0 in | Wt 170.0 lb

## 2012-09-18 DIAGNOSIS — Z0000 Encounter for general adult medical examination without abnormal findings: Secondary | ICD-10-CM

## 2012-09-18 DIAGNOSIS — E785 Hyperlipidemia, unspecified: Secondary | ICD-10-CM

## 2012-09-18 DIAGNOSIS — Z23 Encounter for immunization: Secondary | ICD-10-CM

## 2012-09-18 LAB — CBC WITH DIFFERENTIAL/PLATELET
Basophils Absolute: 0 10*3/uL (ref 0.0–0.1)
Eosinophils Absolute: 0.1 10*3/uL (ref 0.0–0.7)
Lymphocytes Relative: 35.7 % (ref 12.0–46.0)
MCHC: 34.7 g/dL (ref 30.0–36.0)
MCV: 92.5 fl (ref 78.0–100.0)
Monocytes Absolute: 0.6 10*3/uL (ref 0.1–1.0)
Neutrophils Relative %: 53.8 % (ref 43.0–77.0)
Platelets: 228 10*3/uL (ref 150.0–400.0)
RDW: 13.6 % (ref 11.5–14.6)

## 2012-09-18 LAB — POCT URINALYSIS DIPSTICK
Bilirubin, UA: NEGATIVE
Blood, UA: NEGATIVE
Glucose, UA: NEGATIVE
Nitrite, UA: NEGATIVE
Spec Grav, UA: >=1.03
Urobilinogen, UA: 0.2

## 2012-09-18 LAB — LIPID PANEL
HDL: 50.2 mg/dL (ref >39.00)
Total CHOL/HDL Ratio: 4
Triglycerides: 106 mg/dL (ref 0.0–149.0)

## 2012-09-18 LAB — BASIC METABOLIC PANEL
Calcium: 9.4 mg/dL (ref 8.4–10.5)
Creatinine, Ser: 1.1 mg/dL (ref 0.4–1.5)
GFR: 75.3 mL/min (ref >60.00)

## 2012-09-18 LAB — HEPATIC FUNCTION PANEL
Bilirubin, Direct: 0.2 mg/dL (ref 0.0–0.3)
Total Bilirubin: 1 mg/dL (ref 0.3–1.2)

## 2012-09-18 MED ORDER — ATORVASTATIN CALCIUM 10 MG PO TABS: 10.0000 mg | ORAL_TABLET | Freq: Every day | ORAL | Status: DC

## 2012-09-18 NOTE — Progress Notes (Signed)
Patient ID: Brett Ross, male   DOB: May 17, 1945, 67 y.o.   MRN: [id number]  Pt here for f/u-- Lipids- needs f/u  bp at home - 110s-138/66-83.  Ros- no cp, sob  Reviewed pmh, psh, sochx  ROS: Patient denies chest pain, shortness of breath, PND. His appetite is normal. He has no specific musculoskeletal complaints. No other specific complaints in a complete review of systems.   well-developed well-nourished male in no acute distress. HEENT exam atraumatic, normocephalic, neck supple without jugular venous distention. Chest clear to auscultation cardiac exam S1-S2 are regular. Abdominal exam overweight with bowel sounds, soft and nontender. Extremities no edema. Neurologic exam is alert with a normal gait.

## 2012-09-18 NOTE — Assessment & Plan Note (Signed)
Check labs today  He has not had cpx and requests labs today

## 2013-03-02 ENCOUNTER — Other Ambulatory Visit: Payer: Self-pay | Admitting: *Deleted

## 2013-03-02 MED ORDER — FLUTICASONE PROPIONATE 50 MCG/ACT NA SUSP: 2.0000 | Freq: Every day | NASAL | Status: DC

## 2013-05-02 ENCOUNTER — Ambulatory Visit (INDEPENDENT_AMBULATORY_CARE_PROVIDER_SITE_OTHER): Payer: BC Managed Care – PPO | Admitting: Family Medicine

## 2013-05-02 ENCOUNTER — Encounter: Payer: Self-pay | Admitting: Family Medicine

## 2013-05-02 VITALS — BP 127/78 | HR 73 | Temp 98.0°F | Ht 66.0 in | Wt 170.5 lb

## 2013-05-02 DIAGNOSIS — J111 Influenza due to unidentified influenza virus with other respiratory manifestations: Secondary | ICD-10-CM

## 2013-05-02 DIAGNOSIS — J180 Bronchopneumonia, unspecified organism: Secondary | ICD-10-CM

## 2013-05-02 MED ORDER — PREDNISONE 20 MG PO TABS: ORAL_TABLET | ORAL | Status: DC

## 2013-05-02 MED ORDER — AZITHROMYCIN 250 MG PO TABS: ORAL_TABLET | ORAL | Status: DC

## 2013-05-02 NOTE — Progress Notes (Addendum)
OFFICE NOTE  05/02/2013  CC:  Chief Complaint  Patient presents with  . Cough  . Fever     HPI: Patient is a 67 y.o. Caucasian male who is here for fever and cough. Onset 5d/a, cough, feels throat and chest burning and mucous collection, wheezing. Subjective f/c, no temp taken.  Achy in joints diffusely, but this part is better today. Feels slight HA and "head congestion" starting today.  He did get flu vaccine this season. No known sick contacts.  Tylenol and mucinex have been taken.    Pertinent PMH:  Past Medical History  Diagnosis Date  . HYPERLIPIDEMIA 02/06/2007  . Environmental allergies   . Arthritis    Past surgical, social, and family history reviewed and no changes noted since last office visit.  MEDS:  Outpatient Prescriptions Prior to Visit  Medication Sig Dispense Refill  . aspirin 81 MG tablet Take 81 mg by mouth daily.        Marland Kitchen atorvastatin (LIPITOR) 10 MG tablet Take 1 tablet (10 mg total) by mouth daily.  90 tablet  3  . Calcium Carbonate-Vit D-Min (CALTRATE 600+D PLUS) 600-400 MG-UNIT per tablet Chew 1 tablet by mouth daily.        . fluticasone (FLONASE) 50 MCG/ACT nasal spray Place 2 sprays into the nose daily.  48 g  3  . Multiple Vitamins-Minerals (CENTRUM SILVER PO) Take by mouth daily.        . Omega-3 Fatty Acids (SALMON OIL-1000) 200 MG CAPS Take by mouth daily.         No facility-administered medications prior to visit.    PE: Blood pressure 127/78, pulse 73, temperature 98 F (36.7 C), temperature source Temporal, height 5\' 6"  (1.676 m), weight 170 lb 8 oz (77.338 kg), SpO2 98.00%. VS: noted--normal. Gen: alert, NAD, NONTOXIC APPEARING. HEENT: eyes without injection, drainage, or swelling.  Ears: EACs clear, TMs with normal light reflex and landmarks.  Nose: Clear rhinorrhea, with some dried, crusty exudate adherent to mildly injected mucosa.  No purulent d/c.  No paranasal sinus TTP.  No facial swelling.  Throat and mouth without focal  lesion.  No pharyngial swelling, erythema, or exudate.   Neck: supple, no LAD.   LUNGS: CTA bilat, nonlabored resps.  Some post-exhalation coughing is noted. CV: RRR, no m/r/g. EXT: no c/c/e SKIN: no rash  LAB: none  IMPRESSION AND PLAN:  Influenza-like illness, possibly complicated by bacterial bronchopneumonia. Z-pack. Prednisone 40mg  qd x 5d.   Continue mucinex DM.  Pt declined hydrocodone-containing cough syrup today.  An After Visit Summary was printed and given to the patient.  FOLLOW UP: prn

## 2013-05-02 NOTE — Progress Notes (Signed)
Pre-visit discussion using our clinic review tool. No additional management support is needed unless otherwise documented below in the visit note.  

## 2013-10-23 ENCOUNTER — Other Ambulatory Visit: Payer: Self-pay | Admitting: Internal Medicine

## 2013-11-21 ENCOUNTER — Other Ambulatory Visit: Payer: Self-pay | Admitting: Internal Medicine

## 2013-11-22 ENCOUNTER — Telehealth: Payer: Self-pay | Admitting: Internal Medicine

## 2013-11-22 ENCOUNTER — Encounter: Payer: Self-pay | Admitting: Family Medicine

## 2013-11-22 ENCOUNTER — Ambulatory Visit (INDEPENDENT_AMBULATORY_CARE_PROVIDER_SITE_OTHER): Payer: BC Managed Care – PPO | Admitting: Family Medicine

## 2013-11-22 VITALS — BP 120/84 | HR 65 | Temp 97.5°F | Wt 174.0 lb

## 2013-11-22 DIAGNOSIS — R002 Palpitations: Secondary | ICD-10-CM | POA: Insufficient documentation

## 2013-11-22 DIAGNOSIS — E785 Hyperlipidemia, unspecified: Secondary | ICD-10-CM

## 2013-11-22 NOTE — Progress Notes (Signed)
Garret Reddish, MD Phone: [phone]  Subjective:   Brett Ross is a 68 y.o. year old very pleasant male patient who presents with the following:  Palpitations Steroid shot for plantar fasciitis Monday. Since then he has noted intermittent palpitations lasting a few seconds or minutes intermittently. Last night, woke him up at 3 am. Checked pulse and was 140 and BP elevated to 428 systolic though he admits to anxiety when he woke up. Never experienced anything like this before. Drinks 1 coke each AM typically but cannot tell it is worse with this. Overall, he just feels "Different" including mild fatigue for last 3 weeks. He cannot walk for stress test due to the plantar fasciitis.  ROS- no chest pain or shortness of breath or diaphoresis. No orthopnea or PND.   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    Married and has a step son.    Enjoys family time    Medications- reviewed and updated Current Outpatient Prescriptions  Medication Sig Dispense Refill  . aspirin 81 MG tablet Take 81 mg by mouth daily.        Marland Kitchen atorvastatin (LIPITOR) 10 MG tablet TAKE 1 BY MOUTH DAILY  90 tablet  0  . Calcium Carbonate-Vit D-Min (CALTRATE 600+D PLUS) 600-400 MG-UNIT per tablet Chew 1 tablet by mouth daily.        . fluticasone (FLONASE) 50 MCG/ACT nasal spray Place 2 sprays into the nose daily.  48 g  3  . Multiple Vitamins-Minerals (CENTRUM SILVER PO) Take by mouth daily.        . Omega-3 Fatty Acids (SALMON OIL-1000) 200 MG CAPS Take by mouth daily.         No current facility-administered medications for this visit.    Objective: BP 120/84  Pulse  65  Temp(Src) 97.5 F (36.4 C) (Oral)  Wt 174 lb (78.926 kg)  SpO2 98% Gen: NAD, resting comfortably in chair CV: RRR no murmurs rubs or gallops Lungs: CTAB no crackles, wheeze, rhonchi Abdomen: soft/nontender/nondistended/normal bowel sounds. Ext: no edema Skin: warm, dry Neuro: grossly normal, moves all extremities  EKG: sinus bradycardia with rate 59, small ST depression in leads V3 and v4 as well as II, nonspecific st segment changes.   Assessment/Plan:  Palpitations Given strong family history of CAD in 2 brothers as well as hyperlipidemia in age, believe evaluating for ischemia is essential. Continue aspirin and atorvastatin. Have ordered a lexiscan myoview at this time. Also check CBC, CMP, Tsh to evaluate for other potential classes. Reviewed red flags for seeking care.   HYPERLIPIDEMIA Check lipids. Continue atorvastatin.    Orders Placed This Encounter  Procedures  . CBC with Differential  . Comprehensive metabolic panel    [facility]  . TSH    [place]  . Lipid panel    [place]   . Myocardial Perfusion Imaging    Standing Status: Future     Number of Occurrences:      Standing Expiration Date: 11/23/2014    Order Specific Question:  Where should this test be performed    Answer:  [facility])    Order  Specific Question:  Type of stress    Answer:  Lexiscan    Order Specific Question:  Patient weight in lbs    Answer:  174  . EKG 12-Lead

## 2013-11-22 NOTE — Assessment & Plan Note (Signed)
Check lipids. Continue atorvastatin.

## 2013-11-22 NOTE — Patient Instructions (Addendum)
Your palpitations definitely could be related to your prednisone shot. I want to investigate a few things through labs though.   Also, I want to do a stress test. We will set this up and give you a call.   EKG with some nonspecific findings.   For your labs, I will send you a mychart message likely Monday  Orders Placed This Encounter  Procedures  . CBC with Differential  . Comprehensive metabolic panel  . TSH  . Lipid panel    Let's see each other back after the stress test.   Reasons to return or seek care immediately: worsened palpitations, palpitations that do not stop after a few minutes, chest pain, shortness of breath.

## 2013-11-22 NOTE — Telephone Encounter (Signed)
Patient Information:  Caller Name: Kaylan  Phone: [phone]  Patient: Brett Ross, Brett Ross  Gender: Male  DOB: Oct 31, 1945  Age: 68 Years  PCP: Phoebe Sharps (Adults only, leaving end of July 2015)  Office Follow Up:  Does the office need to follow up with this patient?: No  Instructions For The Office: N/A  RN Note:  Called from work.  Denies chest pain or shortness of breath. Palpitations not present now.  Had steroid injection in left foot 11/19/13 for plantar facititis. Mentioned fatigue for past several weeks. Declined next available appointment for 1115 11/22/13 due to a business teleconference.  Transferred to Dawna Part scheduler for appointment after 1300.   Symptoms  Reason For Call & Symptoms: Palpitations woke him from sleep about 0300.  Family history of CAD.  Asking if should see Dr Leanne Chang or wife's cardiologist.  [place] History In EMR: Yes  Reviewed Medications In EMR: Yes  Reviewed Allergies In EMR: Yes  Reviewed Surgeries / Procedures: Yes  Date of Onset of Symptoms: 11/22/2013  Guideline(s) Used:  Heart Rate and Heartbeat Questions  Disposition Per Guideline:   See Today in Office  Reason For Disposition Reached:   Heart beating very rapidly (e.g., > 140 / minute) and not present now (EXCEPTION: during exercise)  Advice Given:  Reassurance  Everybody has palpitations at some point in their lives. Sometimes it is simply a heightened awareness of the heart's normal beating.  Occasional extra heart beats are experienced by most everyone. Lack of sleep, stress, and caffeinated beverages can make palpitations worse.  Patients with anxiety or stress may describe a "rapid heartbeat" or "pounding" in their chest from their heart beating.  Health Basics  Sleep: Try to get sufficient amount of sleep. Lack of sleep can aggravate palpitations. Most people need 7-8 hours of sleep each night.  Exercise: Regular exercise will improve your overall health,  improve your mood, and is a simple method to reduce stress.  Liquid Intake: Drink adequate liquids, 6-8 glasses of water daily.  Avoid Caffeine  Avoid caffeine-containing beverages (Reason: caffeine is a stimulant and can aggravate palpitations).  Examples of caffeine-containing beverages include coffee, tea, colas, Mountain Dew, [employer], and some energy drinks.  Limit Alcohol:  Limit your alcohol consumption to no more than 2 drinks a day. Ideally, eliminate alcohol entirely for the next 2 weeks.  Call Back If:  Chest pain, lightheadedness, or difficulty breathing occurs  Heart beating more than 130 beats / minute  More than 3 extra or skipped beats / minute  You become worse.  Patient Will Follow Care Advice:  YES

## 2013-11-22 NOTE — Telephone Encounter (Signed)
Noted  

## 2013-11-22 NOTE — Assessment & Plan Note (Addendum)
Given strong family history of CAD in 2 brothers as well as hyperlipidemia in age, believe evaluating for ischemia is essential. Continue aspirin and atorvastatin. Have ordered a lexiscan myoview at this time. Also check CBC, CMP, Tsh to evaluate for other potential classes. Reviewed red flags for seeking care.

## 2013-11-27 ENCOUNTER — Telehealth: Payer: Self-pay | Admitting: *Deleted

## 2013-11-27 NOTE — Telephone Encounter (Signed)
Message copied by Marian Sorrow on Tue Nov 27, 2013  8:56 AM ------      Message from: Marin Olp      Created: Tue Nov 27, 2013  8:37 AM       Would you mind calling to have him drop by for labs? I think I forgot to send him to lab ------

## 2013-11-27 NOTE — Telephone Encounter (Signed)
Spoke to pt, told him Dr. Yong Channel forgot to sent him to the lab when he was here for his visit orders are in just need lab appointment. Pt verbalized understanding. Pt transferred to scheduling.

## 2013-11-28 ENCOUNTER — Other Ambulatory Visit (INDEPENDENT_AMBULATORY_CARE_PROVIDER_SITE_OTHER): Payer: BC Managed Care – PPO

## 2013-11-28 DIAGNOSIS — E039 Hypothyroidism, unspecified: Secondary | ICD-10-CM

## 2013-11-28 DIAGNOSIS — E785 Hyperlipidemia, unspecified: Secondary | ICD-10-CM

## 2013-11-28 DIAGNOSIS — I10 Essential (primary) hypertension: Secondary | ICD-10-CM

## 2013-11-28 LAB — COMPREHENSIVE METABOLIC PANEL
ALBUMIN: 3.9 g/dL (ref 3.5–5.2)
ALT: 21 U/L (ref 0–53)
AST: 19 U/L (ref 0–37)
Alkaline Phosphatase: 42 U/L (ref 39–117)
BUN: 17 mg/dL (ref 6–23)
CHLORIDE: 106 meq/L (ref 96–112)
CO2: 29 mEq/L (ref 19–32)
Calcium: 9.3 mg/dL (ref 8.4–10.5)
Creatinine, Ser: 1.1 mg/dL (ref 0.4–1.5)
GFR: 73.41 mL/min (ref >60.00)
GLUCOSE: 94 mg/dL (ref 70–99)
POTASSIUM: 4.5 meq/L (ref 3.5–5.1)
Sodium: 140 mEq/L (ref 135–145)
Total Bilirubin: 0.9 mg/dL (ref 0.2–1.2)
Total Protein: 6.7 g/dL (ref 6.0–8.3)

## 2013-11-28 LAB — CBC WITH DIFFERENTIAL/PLATELET
BASOS PCT: 0.3 % (ref 0.0–3.0)
Basophils Absolute: 0 10*3/uL (ref 0.0–0.1)
EOS PCT: 1.3 % (ref 0.0–5.0)
Eosinophils Absolute: 0.1 10*3/uL (ref 0.0–0.7)
HCT: 44.1 % (ref 39.0–52.0)
HEMOGLOBIN: 14.9 g/dL (ref 13.0–17.0)
LYMPHS PCT: 35.6 % (ref 12.0–46.0)
Lymphs Abs: 2.6 10*3/uL (ref 0.7–4.0)
MCHC: 33.7 g/dL (ref 30.0–36.0)
MCV: 94.9 fl (ref 78.0–100.0)
MONO ABS: 0.7 10*3/uL (ref 0.1–1.0)
Monocytes Relative: 9.1 % (ref 3.0–12.0)
Neutro Abs: 3.9 10*3/uL (ref 1.4–7.7)
Neutrophils Relative %: 53.7 % (ref 43.0–77.0)
Platelets: 242 10*3/uL (ref 150.0–400.0)
RBC: 4.65 Mil/uL (ref 4.22–5.81)
RDW: 13.4 % (ref 11.5–15.5)
WBC: 7.2 10*3/uL (ref 4.0–10.5)

## 2013-11-28 LAB — LIPID PANEL
CHOLESTEROL: 182 mg/dL (ref 0–200)
HDL: 56.8 mg/dL (ref >39.00)
LDL Cholesterol: 96 mg/dL (ref 0–99)
NonHDL: 125.2
TRIGLYCERIDES: 144 mg/dL (ref 0.0–149.0)
Total CHOL/HDL Ratio: 3
VLDL: 28.8 mg/dL (ref 0.0–40.0)

## 2013-11-28 LAB — TSH: TSH: 1.43 u[IU]/mL (ref 0.35–4.50)

## 2013-12-03 ENCOUNTER — Ambulatory Visit (HOSPITAL_COMMUNITY): Payer: BC Managed Care – PPO | Attending: Cardiology | Admitting: Radiology

## 2013-12-03 VITALS — BP 114/68 | Ht 66.0 in | Wt 169.0 lb

## 2013-12-03 DIAGNOSIS — R002 Palpitations: Secondary | ICD-10-CM | POA: Insufficient documentation

## 2013-12-03 DIAGNOSIS — R0602 Shortness of breath: Secondary | ICD-10-CM

## 2013-12-03 DIAGNOSIS — R0989 Other specified symptoms and signs involving the circulatory and respiratory systems: Secondary | ICD-10-CM | POA: Insufficient documentation

## 2013-12-03 DIAGNOSIS — R0609 Other forms of dyspnea: Secondary | ICD-10-CM | POA: Insufficient documentation

## 2013-12-03 MED ORDER — TECHNETIUM TC 99M SESTAMIBI GENERIC - CARDIOLITE
11.0000 | Freq: Once | INTRAVENOUS | Status: AC | PRN
  Administered 2013-12-03: 11 via INTRAVENOUS

## 2013-12-03 MED ORDER — TECHNETIUM TC 99M SESTAMIBI GENERIC - CARDIOLITE
33.0000 | Freq: Once | INTRAVENOUS | Status: AC | PRN
  Administered 2013-12-03: 33 via INTRAVENOUS

## 2013-12-03 MED ORDER — REGADENOSON 0.4 MG/5ML IV SOLN
0.4000 mg | Freq: Once | INTRAVENOUS | Status: AC
  Administered 2013-12-03: 0.4 mg via INTRAVENOUS

## 2013-12-03 NOTE — Progress Notes (Signed)
[facility] [address] [place], [place] [address] [phone]    Cardiology Nuclear Med Study  Brett Ross is a 68 y.o. male     MRN : [id number]     DOB: [birth year]  Procedure Date: 12/03/2013  Nuclear Med Background Indication for Stress Test:  Evaluation for Ischemia History:  No H/O CAD, 05/2001 NL MPI: EF: 57% Cardiac Risk Factors: Strong, Premature Family History - CAD and Lipids  Symptoms:  Palpitations and SOB   Nuclear Pre-Procedure Caffeine/Decaff Intake:  None> 12 hrs NPO After: 6:00am   Lungs:  clear O2 Sat: 98% on room air. IV 0.9% NS with Angio Cath:  22g  IV Site: L Antecubital x 1, tolerated well IV Started by:  Irven Baltimore, RN  Chest Size (in):  42 Cup Size: n/a  Height: 5\' 6"  (1.676 m)  Weight:  169 lb (76.658 kg)  BMI:  Body mass index is 27.29 kg/(m^2). Tech Comments:  N/A    Nuclear Med Study 1 or 2 day study: 1 day  Stress Test Type:  Lexiscan  Reading MD: N/A  Order Authorizing Provider: Garret Reddish, MD  Resting Radionuclide: Technetium 36m Sestamibi  Resting Radionuclide Dose: 11.0 mCi   Stress Radionuclide:  Technetium 71m Sestamibi  Stress Radionuclide Dose: 33.0 mCi           Stress Protocol Rest HR: 60 Stress HR: 87  Rest BP: 114/68 Stress BP: 130/71  Exercise Time (min): n/a METS: n/a   Predicted Max HR: 154 bpm % Max HR: 56.49 bpm Rate Pressure Product: 11310   Dose of Adenosine (mg):  n/a Dose of Lexiscan: 0.4 mg  Dose of Atropine (mg): n/a Dose of Dobutamine: n/a mcg/kg/min (at max HR)  Stress Test Technologist: Perrin Maltese, EMT-P  Nuclear Technologist:  Vedia Pereyra, CNMT     Rest Procedure:  Myocardial perfusion imaging was performed at rest 45 minutes following the intravenous administration of Technetium 40m Sestamibi. Rest ECG: NSR - Normal EKG  Stress Procedure:  The patient received IV Lexiscan 0.4 mg over 15-seconds.  Technetium 71m Sestamibi injected at 30-seconds. This  patient was hot and had sob with the Lexiscan injection. Quantitative spect images were obtained after a 45 minute delay. Stress ECG: No significant change from baseline ECG  QPS Raw Data Images:  Normal; no motion artifact; normal heart/lung ratio. Stress Images:  Normal homogeneous uptake in all areas of the myocardium. Rest Images:  Normal homogeneous uptake in all areas of the myocardium. Subtraction (SDS):  Normal Transient Ischemic Dilatation (Normal <1.22):  1.13 Lung/Heart Ratio (Normal <0.45):  0.33  Quantitative Gated Spect Images QGS EDV:  88 ml QGS ESV:  38 ml  Impression Exercise Capacity:  Lexiscan with no exercise. BP Response:  normal Clinical Symptoms:  There is dyspnea. ECG Impression:  No significant ST segment change suggestive of ischemia. Comparison with Prior Nuclear Study: No images to compare  Overall Impression:  Normal stress nuclear study.  LV Ejection Fraction: 57%.  LV Wall Motion:  NL LV Function; NL Wall Motion   [place]

## 2013-12-17 ENCOUNTER — Other Ambulatory Visit: Payer: Self-pay | Admitting: Internal Medicine

## 2014-03-21 ENCOUNTER — Encounter: Payer: Self-pay | Admitting: Family Medicine

## 2014-03-21 ENCOUNTER — Ambulatory Visit (INDEPENDENT_AMBULATORY_CARE_PROVIDER_SITE_OTHER): Payer: BC Managed Care – PPO | Admitting: Family Medicine

## 2014-03-21 VITALS — BP 102/70 | HR 68 | Temp 97.7°F | Wt 175.0 lb

## 2014-03-21 DIAGNOSIS — B9689 Other specified bacterial agents as the cause of diseases classified elsewhere: Secondary | ICD-10-CM

## 2014-03-21 DIAGNOSIS — A499 Bacterial infection, unspecified: Secondary | ICD-10-CM

## 2014-03-21 DIAGNOSIS — J329 Chronic sinusitis, unspecified: Secondary | ICD-10-CM

## 2014-03-21 MED ORDER — AMOXICILLIN-POT CLAVULANATE 875-125 MG PO TABS: 1.0000 | ORAL_TABLET | Freq: Two times a day (BID) | ORAL | Status: DC

## 2014-03-21 NOTE — Progress Notes (Signed)
  Garret Reddish, MD Phone: [phone]  Subjective:   Brett Ross is a 68 y.o. year old very pleasant male patient who presents with the following:  Sinus pressure/congestion and drainage Week of symptoms. Fatigued tired. Mouth breathing is frustrating (how he describes shortness of breath). Using flonase. Slight bloody discharge. No other medications tried other than claritin and flonase. Continues to worsen each day. Feels some dizziness with sinus pressure. Plane flight on Monday to [facility] to visit mother in law.   ROS- No fevers/chills/nausea/vomiting.   Past Medical History- Patient Active Problem List   Diagnosis Date Noted  . HYPERLIPIDEMIA 02/06/2007    Priority: Medium  . Palpitations 11/22/2013   Medications- reviewed and updated Current Outpatient Prescriptions  Medication Sig Dispense Refill  . aspirin 81 MG tablet Take 81 mg by mouth daily.      Marland Kitchen atorvastatin (LIPITOR) 10 MG tablet TAKE 1 BY MOUTH DAILY 90 tablet 0  . Calcium Carbonate-Vit D-Min (CALTRATE 600+D PLUS) 600-400 MG-UNIT per tablet Chew 1 tablet by mouth daily.      . fluticasone (FLONASE) 50 MCG/ACT nasal spray Place 2 sprays into the nose daily. 48 g 3  . Multiple Vitamins-Minerals (CENTRUM SILVER PO) Take by mouth daily.      . Omega-3 Fatty Acids (SALMON OIL-1000) 200 MG CAPS Take by mouth daily.       No current facility-administered medications for this visit.    Objective: BP 102/70 mmHg  Pulse 68  Temp(Src) 97.7 F (36.5 C) (Oral)  Wt 175 lb (79.379 kg)  SpO2 98% Gen: NAD, resting comfortably on table, fatigued appearing HEENT: nares erythematous and swollen (deviated septum left) with some yellow drainage, oropharynx normal without pharyngeal exudate, TM normal bilaterally-chronic scarring from childhood, Mucous membranes are moist. Tender frontal sinuses.  CV: RRR no murmurs rubs or gallops Lungs: CTAB no crackles, wheeze, rhonchi. No respiratory distress.  Ext: no edema Skin:  warm, dry, no rash   Assessment/Plan:  Bacterial Sinusitis (possibly viral) 1 week of symptoms that continue to worsen even though today with plane flight on Monday. Discussed monitoring until day 10 but with plane flight and upcoming weekend we made a joint decision to treat.  Advised of symptomatic care  Doubt influenza as afebrile SOB is really breathing through mouth due to congestion-we discussed if true SOB arose or if any chest pain or new symptoms he needs to seek care.    Meds ordered this encounter  Medications  . amoxicillin-clavulanate (AUGMENTIN) 875-125 MG per tablet    Sig: Take 1 tablet by mouth 2 (two) times daily.    Dispense:  14 tablet    Refill:  0

## 2014-03-21 NOTE — Progress Notes (Signed)
Pre visit review using our clinic review tool, if applicable. No additional management support is needed unless otherwise documented below in the visit note. 

## 2014-03-21 NOTE — Patient Instructions (Signed)
Sinusitis (presumed bacterial)  Treat with augmentin x 7 days  If no improvement by day 8 or if symptoms worsen or new symptoms please return to care

## 2014-05-30 ENCOUNTER — Other Ambulatory Visit: Payer: Self-pay

## 2014-05-30 MED ORDER — ATORVASTATIN CALCIUM 10 MG PO TABS: 10.0000 mg | ORAL_TABLET | Freq: Every day | ORAL | Status: DC

## 2014-07-23 ENCOUNTER — Other Ambulatory Visit: Payer: Self-pay | Admitting: Family Medicine

## 2014-10-28 ENCOUNTER — Other Ambulatory Visit: Payer: Self-pay

## 2015-01-10 ENCOUNTER — Other Ambulatory Visit: Payer: 59

## 2015-01-14 ENCOUNTER — Other Ambulatory Visit (INDEPENDENT_AMBULATORY_CARE_PROVIDER_SITE_OTHER): Payer: 59

## 2015-01-14 DIAGNOSIS — Z0000 Encounter for general adult medical examination without abnormal findings: Secondary | ICD-10-CM | POA: Diagnosis not present

## 2015-01-14 LAB — CBC WITH DIFFERENTIAL/PLATELET
BASOS ABS: 0 10*3/uL (ref 0.0–0.1)
Basophils Relative: 0.3 % (ref 0.0–3.0)
Eosinophils Absolute: 0.1 10*3/uL (ref 0.0–0.7)
Eosinophils Relative: 1 % (ref 0.0–5.0)
HCT: 45.5 % (ref 39.0–52.0)
Hemoglobin: 15.7 g/dL (ref 13.0–17.0)
LYMPHS ABS: 2.2 10*3/uL (ref 0.7–4.0)
Lymphocytes Relative: 29.1 % (ref 12.0–46.0)
MCHC: 34.5 g/dL (ref 30.0–36.0)
MCV: 93.1 fl (ref 78.0–100.0)
MONO ABS: 0.6 10*3/uL (ref 0.1–1.0)
Monocytes Relative: 7.9 % (ref 3.0–12.0)
NEUTROS ABS: 4.7 10*3/uL (ref 1.4–7.7)
NEUTROS PCT: 61.7 % (ref 43.0–77.0)
PLATELETS: 265 10*3/uL (ref 150.0–400.0)
RBC: 4.89 Mil/uL (ref 4.22–5.81)
RDW: 13.5 % (ref 11.5–15.5)
WBC: 7.6 10*3/uL (ref 4.0–10.5)

## 2015-01-14 LAB — LIPID PANEL
Cholesterol: 183 mg/dL (ref 0–200)
HDL: 59.2 mg/dL (ref >39.00)
LDL Cholesterol: 100 mg/dL — ABNORMAL HIGH (ref 0–99)
NonHDL: 123.68
Total CHOL/HDL Ratio: 3
Triglycerides: 117 mg/dL (ref 0.0–149.0)
VLDL: 23.4 mg/dL (ref 0.0–40.0)

## 2015-01-14 LAB — POCT URINALYSIS DIPSTICK
BILIRUBIN UA: NEGATIVE
Glucose, UA: NEGATIVE
Ketones, UA: NEGATIVE
Leukocytes, UA: NEGATIVE
NITRITE UA: NEGATIVE
PH UA: 5.5
PROTEIN UA: NEGATIVE
RBC UA: NEGATIVE
Spec Grav, UA: 1.025
UROBILINOGEN UA: 0.2

## 2015-01-14 LAB — BASIC METABOLIC PANEL
BUN: 16 mg/dL (ref 6–23)
CO2: 29 mEq/L (ref 19–32)
Calcium: 9.8 mg/dL (ref 8.4–10.5)
Chloride: 107 mEq/L (ref 96–112)
Creatinine, Ser: 1.04 mg/dL (ref 0.40–1.50)
GFR: 75.6 mL/min (ref >60.00)
Glucose, Bld: 90 mg/dL (ref 70–99)
POTASSIUM: 5 meq/L (ref 3.5–5.1)
Sodium: 145 mEq/L (ref 135–145)

## 2015-01-14 LAB — PSA: PSA: 0.94 ng/mL (ref 0.10–4.00)

## 2015-01-14 LAB — HEPATIC FUNCTION PANEL
ALT: 20 U/L (ref 0–53)
AST: 18 U/L (ref 0–37)
Albumin: 4.5 g/dL (ref 3.5–5.2)
Alkaline Phosphatase: 47 U/L (ref 39–117)
Bilirubin, Direct: 0.1 mg/dL (ref 0.0–0.3)
Total Bilirubin: 0.7 mg/dL (ref 0.2–1.2)
Total Protein: 7.1 g/dL (ref 6.0–8.3)

## 2015-01-14 LAB — TSH: TSH: 1.5 u[IU]/mL (ref 0.35–4.50)

## 2015-01-17 ENCOUNTER — Encounter: Payer: Self-pay | Admitting: Family Medicine

## 2015-01-17 ENCOUNTER — Ambulatory Visit (INDEPENDENT_AMBULATORY_CARE_PROVIDER_SITE_OTHER): Payer: 59 | Admitting: Family Medicine

## 2015-01-17 VITALS — BP 140/72 | HR 70 | Temp 98.2°F | Ht 66.0 in | Wt 172.0 lb

## 2015-01-17 DIAGNOSIS — E785 Hyperlipidemia, unspecified: Secondary | ICD-10-CM

## 2015-01-17 DIAGNOSIS — R002 Palpitations: Secondary | ICD-10-CM | POA: Diagnosis not present

## 2015-01-17 DIAGNOSIS — Z23 Encounter for immunization: Secondary | ICD-10-CM

## 2015-01-17 DIAGNOSIS — Z0000 Encounter for general adult medical examination without abnormal findings: Secondary | ICD-10-CM

## 2015-01-17 NOTE — Patient Instructions (Addendum)
Medication Instructions:  No changes, cholesterol a hair high but you are going to work on the eating and trying to bump the exercise up  Labwork: Otherwise looks well  Blood pressure up just a hair today. May be stress related. Would advise DASH eating plan  Testing/Procedures/Immunizations: Received flu shot and final pneumonia shot ([id number]).  Follow-Up (all visit scheduling, rescheduling, cancellations including labs should be scheduled at front desk): 1 year physical. Try to keep an eye on the blood pressure and if >140/90 on a regular basis see me sooner   Sooner if you need [place] or if you have new or worsening symptoms  DASH Eating Plan DASH stands for "Dietary Approaches to Stop Hypertension." The DASH eating plan is a healthy eating plan that has been shown to reduce high blood pressure (hypertension). Additional health benefits may include reducing the risk of type 2 diabetes mellitus, heart disease, and stroke. The DASH eating plan may also help with weight loss. WHAT DO I NEED TO KNOW ABOUT THE DASH EATING PLAN? For the DASH eating plan, you will follow these general guidelines:  Choose foods with a percent daily value for sodium of less than 5% (as listed on the food label).  Use salt-free seasonings or herbs instead of table salt or sea salt.  Check with your health care provider or pharmacist before using salt substitutes.  Eat lower-sodium products, often labeled as "lower sodium" or "no salt added."  Eat fresh foods.  Eat more vegetables, fruits, and low-fat dairy products.  Choose whole grains. Look for the word "whole" as the first word in the ingredient list.  Choose fish and skinless chicken or [place] more often than red meat. Limit fish, poultry, and meat to 6 oz (170 g) each day.  Limit sweets, desserts, sugars, and sugary drinks.  Choose heart-healthy fats.  Limit cheese to 1 oz (28 g) per day.  Eat more home-cooked food and less restaurant, buffet,  and fast food.  Limit fried foods.  Cook foods using methods other than frying.  Limit canned vegetables. If you do use them, rinse them well to decrease the sodium.  When eating at a restaurant, ask that your food be prepared with less salt, or no salt if possible. WHAT FOODS CAN I EAT? Seek help from a dietitian for individual calorie needs. Grains Whole grain or whole wheat bread. Brown rice. Whole grain or whole wheat pasta. Quinoa, bulgur, and whole grain cereals. Low-sodium cereals. Corn or whole wheat flour tortillas. Whole grain cornbread. Whole grain crackers. Low-sodium crackers. Vegetables Fresh or frozen vegetables (raw, steamed, roasted, or grilled). Low-sodium or reduced-sodium tomato and vegetable juices. Low-sodium or reduced-sodium tomato sauce and paste. Low-sodium or reduced-sodium canned vegetables.  Fruits All fresh, canned (in natural juice), or frozen fruits. Meat and Other Protein Products Ground beef (85% or leaner), grass-fed beef, or beef trimmed of fat. Skinless chicken or [place]. Ground chicken or [place]. Pork trimmed of fat. All fish and seafood. Eggs. Dried beans, peas, or lentils. Unsalted nuts and seeds. Unsalted canned beans. Dairy Low-fat dairy products, such as skim or 1% milk, 2% or reduced-fat cheeses, low-fat ricotta or cottage cheese, or plain low-fat yogurt. Low-sodium or reduced-sodium cheeses. Fats and Oils Tub margarines without trans fats. Light or reduced-fat mayonnaise and salad dressings (reduced sodium). Avocado. Safflower, olive, or canola oils. Natural peanut or almond butter. Other Unsalted popcorn and pretzels. The items listed above may not be a complete list of recommended foods or beverages. Contact your  dietitian for more options. WHAT FOODS ARE NOT RECOMMENDED? Grains White bread. White pasta. White rice. Refined cornbread. Bagels and croissants. Crackers that contain trans fat. Vegetables Creamed or fried vegetables. Vegetables  in a cheese sauce. Regular canned vegetables. Regular canned tomato sauce and paste. Regular tomato and vegetable juices. Fruits Dried fruits. Canned fruit in light or heavy syrup. Fruit juice. Meat and Other Protein Products Fatty cuts of meat. Ribs, chicken wings, bacon, sausage, bologna, salami, chitterlings, fatback, hot dogs, bratwurst, and packaged luncheon meats. Salted nuts and seeds. Canned beans with salt. Dairy Whole or 2% milk, cream, half-and-half, and cream cheese. Whole-fat or sweetened yogurt. Full-fat cheeses or blue cheese. Nondairy creamers and whipped toppings. Processed cheese, cheese spreads, or cheese curds. Condiments Onion and garlic salt, seasoned salt, table salt, and sea salt. Canned and packaged gravies. Worcestershire sauce. Tartar sauce. Barbecue sauce. Teriyaki sauce. Soy sauce, including reduced sodium. Steak sauce. Fish sauce. Oyster sauce. Cocktail sauce. Horseradish. Ketchup and mustard. Meat flavorings and tenderizers. Bouillon cubes. Hot sauce. Tabasco sauce. Marinades. Taco seasonings. Relishes. Fats and Oils Butter, stick margarine, lard, shortening, ghee, and bacon fat. Coconut, palm kernel, or palm oils. Regular salad dressings. Other Pickles and olives. Salted popcorn and pretzels. The items listed above may not be a complete list of foods and beverages to avoid. Contact your dietitian for more information. WHERE CAN I FIND MORE INFORMATION? National Heart, Lung, and Blood Institute: travelstabloid.com Document Released: 04/08/2011 Document Revised: 09/03/2013 Document Reviewed: 02/21/2013 [facility] Patient Information 2015 [place], [place]. This information is not intended to replace advice given to you by your health care provider. Make sure you discuss any questions you have with your health care provider.

## 2015-01-17 NOTE — Progress Notes (Signed)
Brett Reddish, MD Phone: [phone]  Subjective:  Patient presents today for their annual physical. Chief complaint-noted.   -overall doing well. Stress helping wife and with work. Updated eye surgery history- unfortunately has had several issues over last year. Otherwise history unchanged.   ROS- full  review of systems was completed and negative. No longer having palpitations. No chest pain or shortness of breath.   The following were reviewed and entered/updated in epic: Past Medical History  Diagnosis Date  . HYPERLIPIDEMIA 02/06/2007  . Environmental allergies   . Arthritis   . Detached retina     L 04/2014.    Patient Active Problem List   Diagnosis Date Noted  . Hyperlipidemia 02/06/2007    Priority: Medium  . Palpitations 11/22/2013   Past Surgical History  Procedure Laterality Date  . Knee arthroscopy  2007    left  . Cataract surgery      01/09/15 left  . Vitrectomy      06/2014    Family History  Problem Relation Age of Onset  . Heart failure Mother     CHF and heart disease; had breast cancer in 47s  . Leukemia Father   . Colon cancer Neg Hx   . Rectal cancer Neg Hx   . Stomach cancer Neg Hx   . Melanoma Brother   . Heart disease Brother 78    mi, stent  . Heart disease Brother 41    MI-required stent    Medications- reviewed and updated Current Outpatient Prescriptions  Medication Sig Dispense Refill  . aspirin 81 MG tablet Take 81 mg by mouth daily.      Marland Kitchen atorvastatin (LIPITOR) 10 MG tablet Take 1 tablet by mouth  daily 90 tablet 3  . Calcium Carbonate-Vit D-Min (CALTRATE 600+D PLUS) 600-400 MG-UNIT per tablet Chew 1 tablet by mouth daily.      . fluticasone (FLONASE) 50 MCG/ACT nasal spray Place 2 sprays into the nose daily. 48 g 3  . Multiple Vitamins-Minerals (CENTRUM SILVER PO) Take by mouth daily.      . Omega-3 Fatty Acids (SALMON OIL-1000) 200 MG CAPS Take by mouth daily.       No current facility-administered medications for this  visit.    Allergies-reviewed and updated Allergies  Allergen Reactions  . Caffeine     REACTION: tinnitis    Social History   Social History  . Marital Status: Married    Spouse Name: N/A  . Number of Children: N/A  . Years of Education: N/A   Social History Main Topics  . Smoking status: Never Smoker   . Smokeless tobacco: None  . Alcohol Use: 4.2 oz/week    7 Cans of beer per week  . Drug Use: No  . Sexual Activity: Not Asked   Other Topics Concern  . 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    Married and has a step son.    Enjoys family time    ROS--See HPI   Objective: BP 140/72 mmHg  Pulse 70  Temp(Src) 98.2 F (36.8 C)  Ht 5\' 6"  (1.676 m)  Wt 172 lb (78.019 kg)  BMI 27.77 kg/m2 Gen: NAD, resting comfortably HEENT: Mucous membranes are moist. Oropharynx normal Neck: no thyromegaly CV: RRR no murmurs rubs or gallops Lungs: CTAB no crackles, wheeze, rhonchi Abdomen: soft/nontender/nondistended/normal  bowel sounds. No rebound or guarding.  Rectal: normal tone, normalprostate, no masses or tenderness Ext: no edema, 2+ PT pulses Skin: warm, dry, no BCC, AK, SCC, melanoma obvious on full exam Neuro: grossly normal, moves all extremities, PERRLA  Assessment/Plan:  69 y.o. male presenting for annual physical.  Health Maintenance counseling: 1. Anticipatory guidance: Patient counseled regarding regular dental exams- has had an implant, wearing seatbelts, wearing sunscreen, regular eye visits 2. Risk factor reduction:  Advised patient of need for regular exercise and diet rich and fruits and vegetables to reduce risk of heart attack and stroke. Not getting regular exercise-stress caring for wife as well as work.  3. Immunizations/screenings/ancillary studies Health Maintenance Due  Topic Date Due  . Hepatitis C  Screening  02/08/1946  . PNA vac Low Risk Adult (2 of 2 - PCV13)- Prevnar 13 today 09/18/2013  . INFLUENZA VACCINE - today 12/02/2014  4. Prostate cancer screening- PSA lower than last year. Rectal also low risk  5. Colon cancer screening - 03/03/11 colonoscopy normal with 10 year repeat 6. Skin cancer screening- full exam today and no high risk lesions noted  Hyperlipidemia S: controlled last visit, mild LDL elevation on atorvastatin 10mg . Admits to worsened diet, low exercise A/P:Continue current meds:  He wants to work on lifestyle changes before increasing medicine. Follow up 1 year.     BP Readings from Last 3 Encounters:  01/17/15 140/72  03/21/14 102/70  12/03/13 114/68  keep an eye on blood pressure. DASH diet/increase activity. No issues in the past.   1 year CPE unless BP elevated on home readings >140/90  Orders Placed This Encounter  Procedures  . Pneumococcal conjugate vaccine 13-valent  . Flu Vaccine QUAD 36+ mos IM

## 2015-01-17 NOTE — Assessment & Plan Note (Signed)
S: controlled last visit, mild LDL elevation on atorvastatin 10mg . Admits to worsened diet, low exercise A/P:Continue current meds:  He wants to work on lifestyle changes before increasing medicine. Follow up 1 year.

## 2015-04-21 ENCOUNTER — Encounter: Payer: Self-pay | Admitting: Internal Medicine

## 2015-07-15 ENCOUNTER — Other Ambulatory Visit: Payer: Self-pay | Admitting: Family Medicine

## 2015-11-21 ENCOUNTER — Ambulatory Visit (INDEPENDENT_AMBULATORY_CARE_PROVIDER_SITE_OTHER)
Admission: RE | Admit: 2015-11-21 | Discharge: 2015-11-21 | Disposition: A | Discharge status: Home / Self Care | Payer: 59 | Source: Ambulatory Visit | Attending: Internal Medicine | Admitting: Internal Medicine

## 2015-11-21 ENCOUNTER — Telehealth: Payer: Self-pay | Admitting: Family Medicine

## 2015-11-21 ENCOUNTER — Other Ambulatory Visit: Payer: Self-pay | Admitting: Internal Medicine

## 2015-11-21 ENCOUNTER — Ambulatory Visit (INDEPENDENT_AMBULATORY_CARE_PROVIDER_SITE_OTHER): Payer: 59 | Admitting: Internal Medicine

## 2015-11-21 ENCOUNTER — Encounter: Payer: Self-pay | Admitting: Internal Medicine

## 2015-11-21 VITALS — BP 140/82 | Temp 97.9°F | Ht 66.0 in | Wt 171.8 lb

## 2015-11-21 DIAGNOSIS — R194 Change in bowel habit: Secondary | ICD-10-CM

## 2015-11-21 DIAGNOSIS — R1031 Right lower quadrant pain: Secondary | ICD-10-CM | POA: Diagnosis not present

## 2015-11-21 DIAGNOSIS — R102 Pelvic and perineal pain: Secondary | ICD-10-CM

## 2015-11-21 LAB — CBC WITH DIFFERENTIAL/PLATELET
BASOS ABS: 0 10*3/uL (ref 0.0–0.1)
Basophils Relative: 0.3 % (ref 0.0–3.0)
EOS ABS: 0.1 10*3/uL (ref 0.0–0.7)
Eosinophils Relative: 1.3 % (ref 0.0–5.0)
HEMATOCRIT: 45.5 % (ref 39.0–52.0)
HEMOGLOBIN: 15.5 g/dL (ref 13.0–17.0)
LYMPHS PCT: 32.1 % (ref 12.0–46.0)
Lymphs Abs: 2.9 10*3/uL (ref 0.7–4.0)
MCHC: 34.1 g/dL (ref 30.0–36.0)
MCV: 92.3 fl (ref 78.0–100.0)
Monocytes Absolute: 0.7 10*3/uL (ref 0.1–1.0)
Monocytes Relative: 8 % (ref 3.0–12.0)
Neutro Abs: 5.2 10*3/uL (ref 1.4–7.7)
Neutrophils Relative %: 58.3 % (ref 43.0–77.0)
Platelets: 266 10*3/uL (ref 150.0–400.0)
RBC: 4.94 Mil/uL (ref 4.22–5.81)
RDW: 13.8 % (ref 11.5–15.5)
WBC: 9 10*3/uL (ref 4.0–10.5)

## 2015-11-21 LAB — POC URINALSYSI DIPSTICK (AUTOMATED)
BILIRUBIN UA: NEGATIVE
Blood, UA: NEGATIVE
GLUCOSE UA: NEGATIVE
KETONES UA: NEGATIVE
LEUKOCYTES UA: NEGATIVE
Nitrite, UA: NEGATIVE
Protein, UA: NEGATIVE
SPEC GRAV UA: 1.01
Urobilinogen, UA: 0.2
pH, UA: 6

## 2015-11-21 LAB — HEPATIC FUNCTION PANEL
ALK PHOS: 47 U/L (ref 39–117)
ALT: 20 U/L (ref 0–53)
AST: 16 U/L (ref 0–37)
Albumin: 4.5 g/dL (ref 3.5–5.2)
BILIRUBIN DIRECT: 0.1 mg/dL (ref 0.0–0.3)
BILIRUBIN TOTAL: 0.7 mg/dL (ref 0.2–1.2)
Total Protein: 7.1 g/dL (ref 6.0–8.3)

## 2015-11-21 LAB — BASIC METABOLIC PANEL
BUN: 13 mg/dL (ref 6–23)
CALCIUM: 9.8 mg/dL (ref 8.4–10.5)
CO2: 32 meq/L (ref 19–32)
CREATININE: 1 mg/dL (ref 0.40–1.50)
Chloride: 104 mEq/L (ref 96–112)
GFR: 78.9 mL/min (ref >60.00)
Glucose, Bld: 88 mg/dL (ref 70–99)
Potassium: 4.4 mEq/L (ref 3.5–5.1)
Sodium: 141 mEq/L (ref 135–145)

## 2015-11-21 LAB — C-REACTIVE PROTEIN: CRP: 0.1 mg/dL — AB (ref 0.5–20.0)

## 2015-11-21 IMAGING — CT CT ABD-PELV W/ CM
2 of 5 series · 15 of 46 positions shown, 17 images · IV contrast (ISOVUE 300)
Comparison: None.

CLINICAL DATA: Abdominal pain, right lower quadrant pain starting
yesterday

EXAM:
CT ABDOMEN AND PELVIS WITH CONTRAST
TECHNIQUE: Multidetector CT imaging of the abdomen and pelvis was performed
using the standard protocol following bolus administration of
intravenous contrast.
CONTRAST:  100 cc Isovue

[Series 2: abd/ pelvis · axial · 0.83mm/px · z∈[+810,+1220]mm · 12 of 92 slices shown, 14 images]
[im 5/92  soft-tissue]
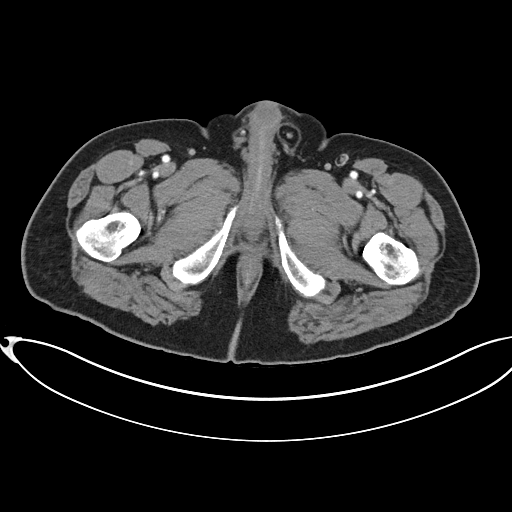
[im 5/92  bone]
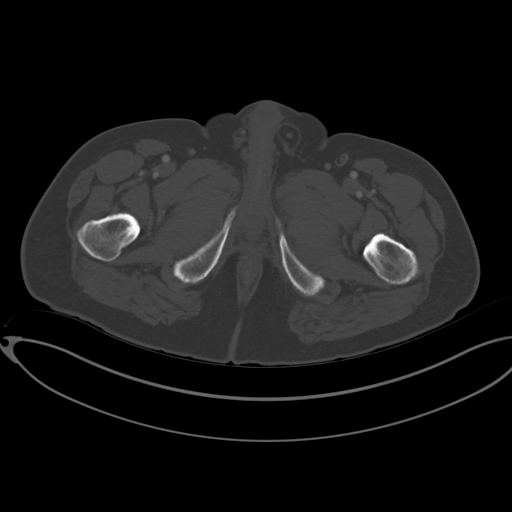
[im 15/92  soft-tissue]
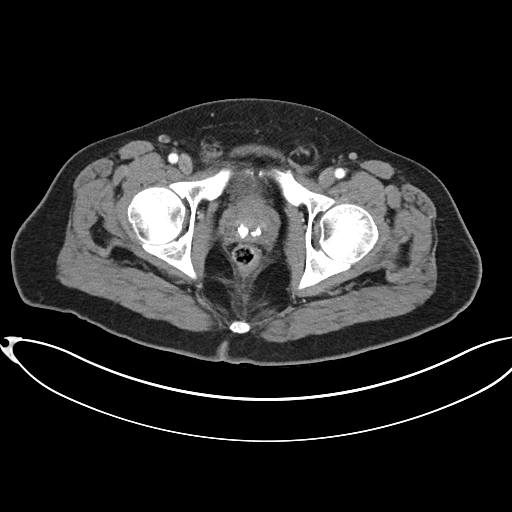
[im 20/92  soft-tissue]
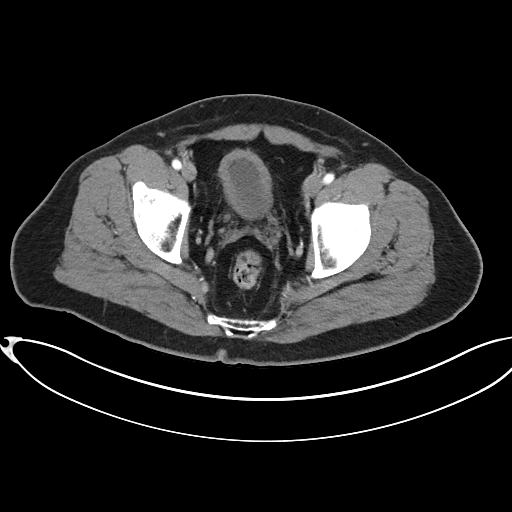
[im 29/92  soft-tissue]
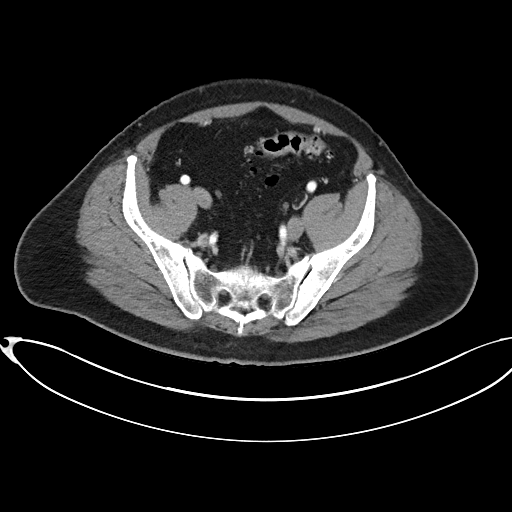
[im 34/92  soft-tissue]
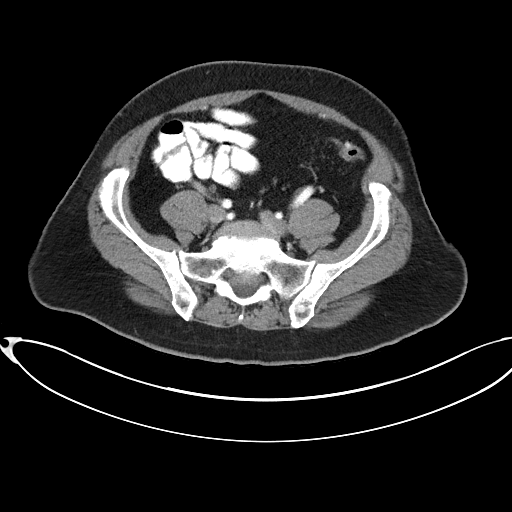
[im 44/92  soft-tissue]
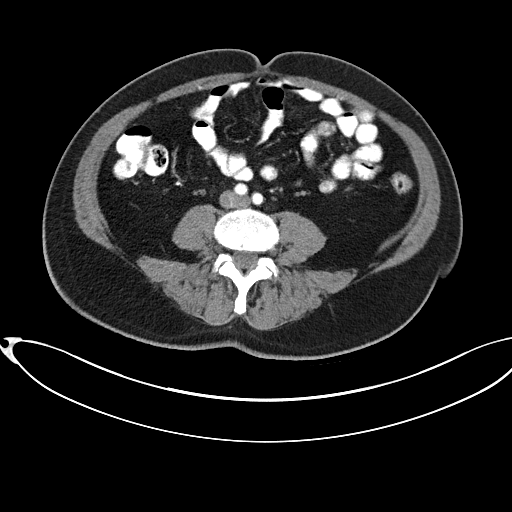
[im 48/92  soft-tissue]
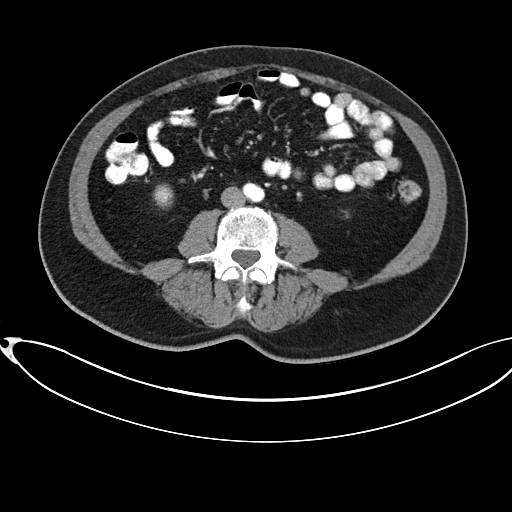
[im 58/92  soft-tissue]
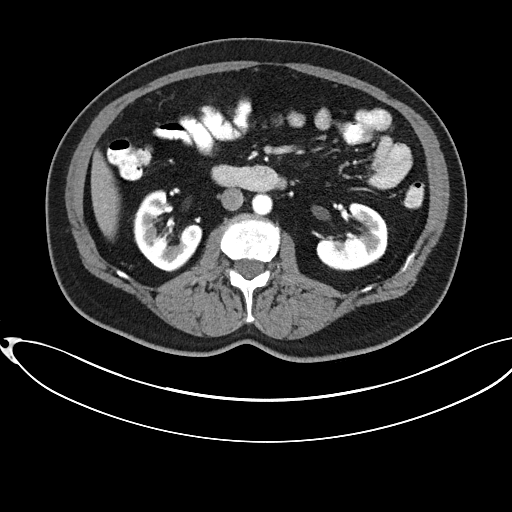
[im 63/92  soft-tissue]
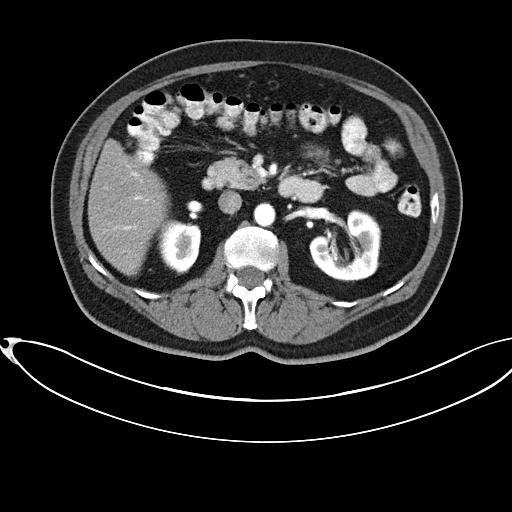
[im 63/92  bone]
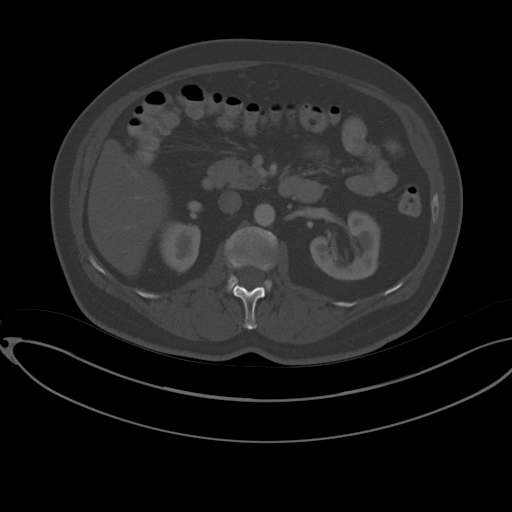
[im 72/92  soft-tissue]
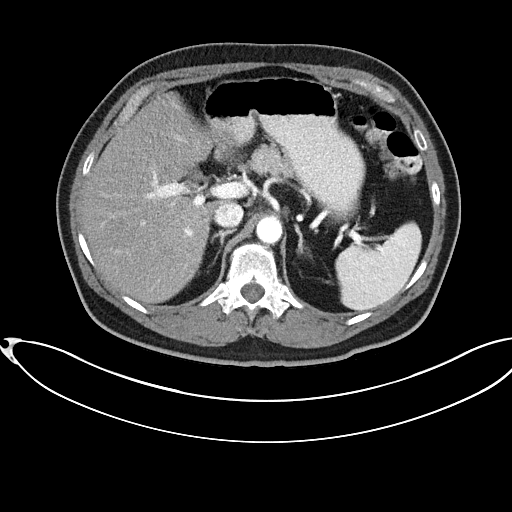
[im 77/92  soft-tissue]
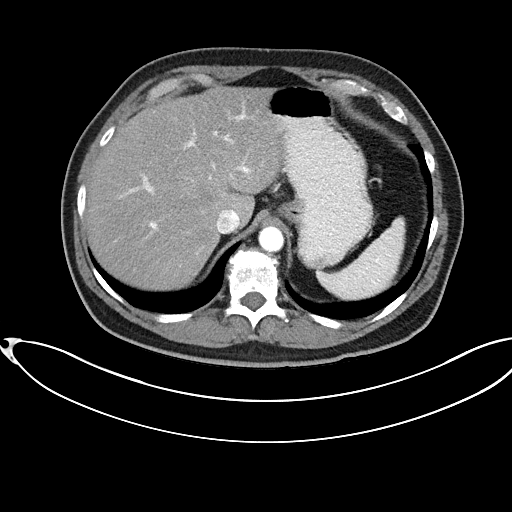
[im 87/92  soft-tissue]
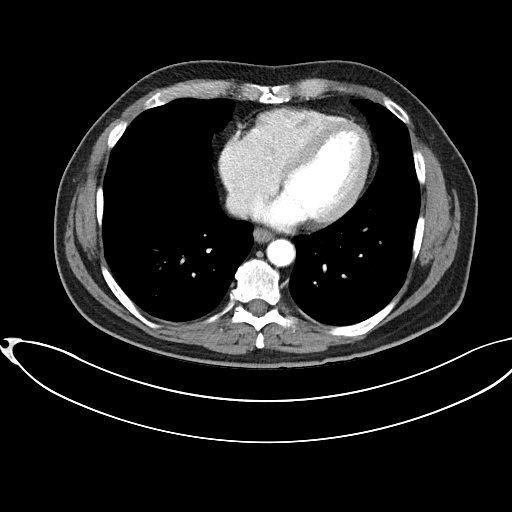

[Series 5: coronal soft tissue · coronal · 0.68mm/px · 3 of 101 slices shown]
[im 34/101  soft-tissue]
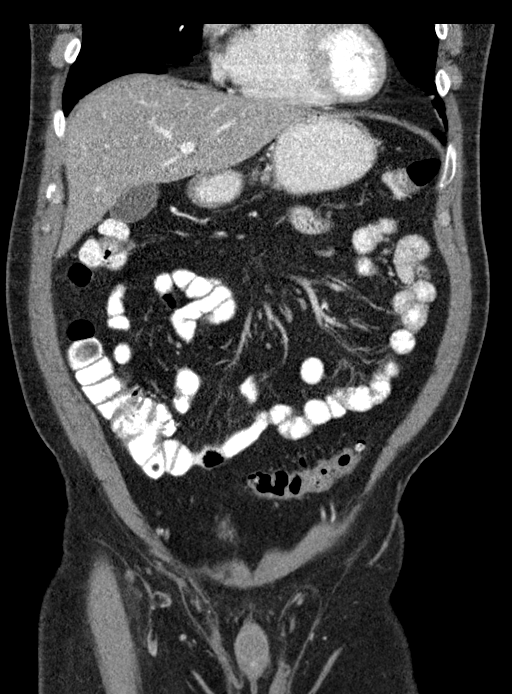
[im 45/101  soft-tissue]
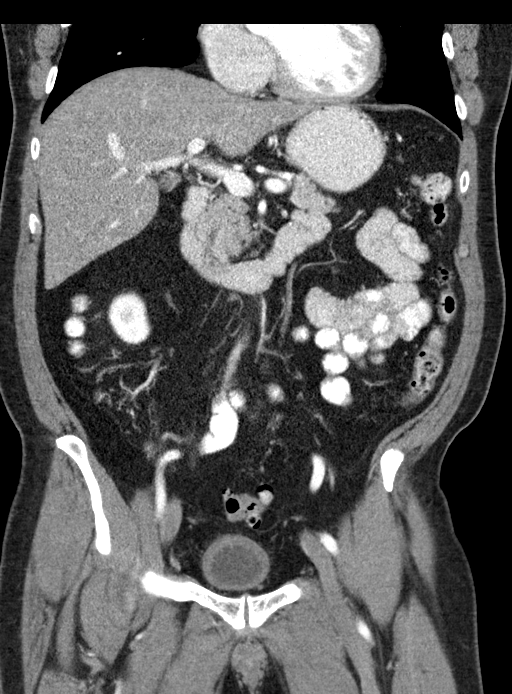
[im 56/101  soft-tissue]
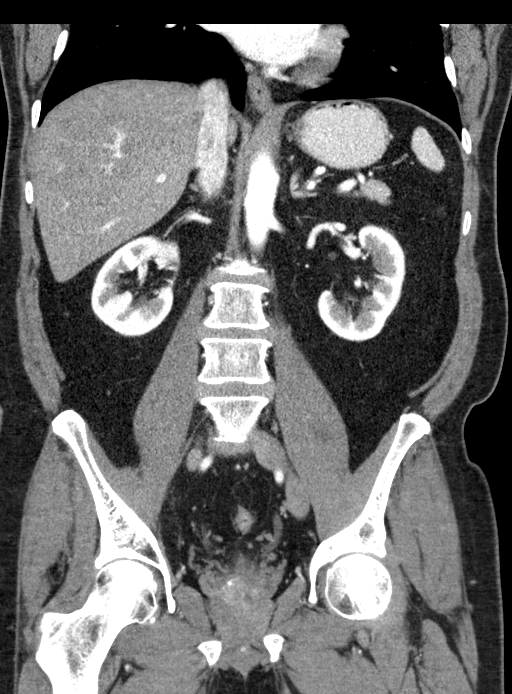

[15 of 46 positions shown; findings below may reference images not displayed]

FINDINGS: Lower chest:  The lung bases are unremarkable.

Hepatobiliary: Mild fatty infiltration of the liver. No focal
hepatic mass. No calcified gallstones are noted within gallbladder.

Pancreas: No mass, inflammatory changes, or other significant
abnormality.

Spleen: Within normal limits in size and appearance.

Adrenals/Urinary Tract: No adrenal gland mass. Enhanced kidneys are
symmetrical in size. No hydronephrosis or hydroureter. Delayed renal
images shows bilateral renal symmetrical excretion. Bilateral
visualized proximal ureter is unremarkable.

Stomach/Bowel: No small bowel obstruction. No thickened or dilated
small bowel loops. Normal appendix is noted in axial image 57. The
terminal ileum is unremarkable. Few diverticula are noted distal
left colon. Multiple sigmoid colon diverticula are noted. There is
no evidence of acute colitis or acute diverticulitis.

Vascular/Lymphatic: Mild atherosclerotic calcifications of distal
abdominal aorta. No aortic aneurysm. No retroperitoneal or
mesenteric adenopathy.

Reproductive: Prostate gland calcifications are noted. The prostate
gland is not enlarged. Seminal vesicles are unremarkable.

Other: There is thickening of urinary bladder wall. Cystitis cannot
be excluded. The urinary bladder is under distended. There is a left
inguinal scrotal canal hernia containing fat measures 2.3 cm. A
right inguinal scrotal canal hernia containing fat measures 1.7 cm.

Musculoskeletal: No destructive bony lesions are noted within
pelvis.

Sagittal images of the spine shows mild degenerative changes
thoracolumbar spine. There is disc space flattening with vacuum disc
phenomenon and mild anterior spurring at L3-L4 level.
IMPRESSION: 1. No acute inflammatory process within abdomen.
2. Mild fatty infiltration of the liver.
3. No pericecal inflammation.  Normal appendix.
4. No small bowel obstruction.
5. There is thickening of the wall of under distended urinary
bladder. Cystitis cannot be excluded.
6. Sigmoid colon diverticula are noted. No evidence of acute
diverticulitis.
7. Prostate gland calcifications are noted.

## 2015-11-21 MED ORDER — IOPAMIDOL (ISOVUE-300) INJECTION 61%: 100.0000 mL | Freq: Once | INTRAVENOUS | Status: DC | PRN

## 2015-11-21 NOTE — Telephone Encounter (Signed)
Patient Name: Brett Ross DOB: February 03, 1946 Initial Comment Caller states been having soreness in abd, has a tearing sensation in lower abd Nurse Assessment Nurse: Ronnald Ramp, RN, Miranda Date/Time (Eastern Time): 11/21/2015 8:10:16 AM Confirm and document reason for call. If symptomatic, describe symptoms. You must click the next button to save text entered. ---Caller states he has been having soreness off and on in is "gut" for the last few weeks. Last night he started feeling a "tearing sensation" in his right lower abdomen. Denies vomiting but having loose stool. Has the patient traveled out of the country within the last 30 days? ---No Does the patient have any new or worsening symptoms? ---Yes Will a triage be completed? ---Yes Related visit to physician within the last 2 weeks? ---No Does the PT have any chronic conditions? (i.e. diabetes, asthma, etc.) ---No Is this a behavioral health or substance abuse call? ---No Guidelines Guideline Title Affirmed Question Affirmed Notes Diarrhea Abdominal pain (Exception: Pain clears with each passage of diarrhea stool) Final Disposition User See Physician within 24 Hours Ronnald Ramp, RN, Miranda Comments No appt available with PCP. Appt scheduled with Dr. Regis Bill at 11:15a Referrals REFERRED TO PCP OFFICE Disagree/Comply: Comply

## 2015-11-21 NOTE — Progress Notes (Signed)
Pre visit review using our clinic review tool, if applicable. No additional management support is needed unless otherwise documented below in the visit note.  Chief Complaint  Patient presents with  . RLQ Pain with sudden movements  . Abd Discomfort X1 month    HPI: Brett Ross 70 y.o.  Comes in for acute sda appt  PCP NA   2 weeks of abd pain  +With the patient calls as "gut problems". And then yesterday he had the onset of a severe  tearing senseation of rlq   A bit more frequent loose stool no vomiting   And called team health who referred in for acute  appt .  No feer blood noted  righ lower pain  Sometimes radiates to upper thigh/ no mass felt  utd on colon . No new meds  No swelling in area but worse with motion  Not cough or sneeze?   Denies injury but  Did note when he was shooing away geese.    ROS: See pertinent positives and negatives per HPI. Has never had change in bowel habits  Go issues  No travel  uto sx.   Past Medical History  Diagnosis Date  . HYPERLIPIDEMIA 02/06/2007  . Environmental allergies   . Arthritis   . Detached retina     L 04/2014.     Family History  Problem Relation Age of Onset  . Heart failure Mother     CHF and heart disease; had breast cancer in 56s  . Leukemia Father   . Colon cancer Neg Hx   . Rectal cancer Neg Hx   . Stomach cancer Neg Hx   . Melanoma Brother   . Heart disease Brother 62    mi, stent  . Heart disease Brother 13    MI-required stent    Social History   Social History  . Marital Status: Married    Spouse Name: N/A  . Number of Children: N/A  . Years of Education: N/A   Social History Main Topics  . Smoking status: Never Smoker   . Smokeless tobacco: None  . Alcohol Use: 4.2 oz/week    7 Cans of beer per week  . Drug Use: No  . Sexual Activity: Not Asked   Other Topics Concern  . 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    Married and has a step son.    Enjoys family time    Outpatient Prescriptions Prior to Visit  Medication Sig Dispense Refill  . aspirin 81 MG tablet Take 81 mg by mouth daily.      Marland Kitchen atorvastatin (LIPITOR) 10 MG tablet Take 1 tablet by mouth  daily 90 tablet 3  . Calcium Carbonate-Vit D-Min (CALTRATE 600+D PLUS) 600-400 MG-UNIT per tablet Chew 1 tablet by mouth daily.      . fluticasone (FLONASE) 50 MCG/ACT nasal spray Place 2 sprays into the nose daily. 48 g 3  . Multiple Vitamins-Minerals (CENTRUM SILVER PO) Take by mouth daily.      . Omega-3 Fatty Acids (SALMON OIL-1000) 200 MG CAPS Take by mouth daily.       No facility-administered medications prior to visit.     EXAM:  BP 140/82 mmHg  Temp(Src) 97.9 F (36.6 C) (Oral)  Ht 5\' 6"  (1.676 m)  Wt 171 lb  12.8 oz (77.928 kg)  BMI 27.74 kg/m2  Body mass index is 27.74 kg/(m^2).  GENERAL: vitals reviewed and listed above, alert, oriented, appears well hydrated and in no acute distress mildy anxious in distress when moving  Up from table and some motinos  HEENT: atraumatic, conjunctiva  clear, no obvious abnormalities on inspection of external nose and ears OP : no lesion edema or exudate  NECK: no obvious masses on inspection palpation  LUNGS: clear to auscultation bilaterally, no wheezes, rales or rhonchi, good air movement CV: HRRR, no clubbing cyanosis or  peripheral edema nl cap refill  Abdomen:  Sof,t normal bowel sounds without hepatosplenomegaly, norebound or masses no CVA tenderness no obv hernia when standing   Diffuse tenderness low pelvic area but mild guarding no masses  Active rom hip painful but passive not much and neg psoas sign MS: moves all extremities without noticeable focal  abnormality PSYCH: pleasant and cooperative, no obvious depression or anxiety Lab Results  Component Value Date   WBC 9.0 11/21/2015   HGB 15.5 11/21/2015   HCT 45.5  11/21/2015   PLT 266.0 11/21/2015   GLUCOSE 88 11/21/2015   CHOL 183 01/14/2015   TRIG 117.0 01/14/2015   HDL 59.20 01/14/2015   LDLCALC 100* 01/14/2015   ALT 20 11/21/2015   AST 16 11/21/2015   NA 141 11/21/2015   K 4.4 11/21/2015   CL 104 11/21/2015   CREATININE 1.00 11/21/2015   BUN 13 11/21/2015   CO2 32 11/21/2015   TSH 1.50 01/14/2015   PSA 0.94 01/14/2015    ASSESSMENT AND PLAN:  Discussed the following assessment and plan:  Abdominal pain, RLQ - Plan: Basic metabolic panel, CBC with Differential/Platelet, POCT Urinalysis, Dipstick, C-reactive protein, Hepatic function panel, CT ABDOMEN PELVIS W CONTRAST, POCT Urinalysis Dipstick (Automated)  Pelvic pain in male - Plan: CT ABDOMEN PELVIS W CONTRAST, POCT Urinalysis Dipstick (Automated)  Change in bowel habits - Plan: CT ABDOMEN PELVIS W CONTRAST, POCT Urinalysis Dipstick (Automated)  I don't think the history of the spider bite with rash weeks ago is related. Uncertain cause of the change in his bowel habits is nothing his change knees never had this before. Right lower quadrant were inguinal area pelvic pain without discrete injury could still be abdominal wall  Inguinal hernia etc. but with the GI complaints and it is Friday will proceed with blood work urinalysis and CT scan to delineate causes. Blood pressure was up on initial visit improved on second reading. Stay on light diet clear fluids for now until CT done. -Patient advised to return or notify health care team  if symptoms worsen ,persist or new concerns arise.  Patient Instructions   Will notify you  of labs when available.  Getting lab and ct scan to check the area   Acts somewhat like a hernia or groin problem but   Because of change in bowel habits   Checking for diverticulitis or appendix problem .   Your BP is up  Depending on results   Plan ROV with PCP in 1-2 weeks     Standley Brooking. Jamicia Haaland M.D.

## 2015-11-21 NOTE — Patient Instructions (Signed)
  Will notify you  of labs when available.  Getting lab and ct scan to check the area   Acts somewhat like a hernia or groin problem but   Because of change in bowel habits   Checking for diverticulitis or appendix problem .   Your BP is up  Depending on results   Plan ROV with PCP in 1-2 weeks

## 2015-11-24 ENCOUNTER — Telehealth: Payer: Self-pay | Admitting: Internal Medicine

## 2015-11-24 ENCOUNTER — Other Ambulatory Visit: Payer: Self-pay | Admitting: Family Medicine

## 2015-11-24 DIAGNOSIS — K4020 Bilateral inguinal hernia, without obstruction or gangrene, not specified as recurrent: Secondary | ICD-10-CM

## 2015-11-24 NOTE — Telephone Encounter (Signed)
Patient with abdominal pain and had a recent CT ordered by his primary .  He reports bloating and a change in bowel habits. He is requesting an earlier appt.  I offered an appt with APP and he declined, he would prefer to wait for Dr. Henrene Ross.  He is advised that I will place him on the cancellation list.  He will call back if he changes his mind and wants to see APP.

## 2015-12-10 ENCOUNTER — Encounter: Payer: Self-pay | Admitting: Family Medicine

## 2015-12-10 ENCOUNTER — Ambulatory Visit (INDEPENDENT_AMBULATORY_CARE_PROVIDER_SITE_OTHER): Payer: 59 | Admitting: Family Medicine

## 2015-12-10 VITALS — BP 130/78 | HR 61 | Temp 97.4°F | Wt 170.8 lb

## 2015-12-10 DIAGNOSIS — R102 Pelvic and perineal pain: Secondary | ICD-10-CM

## 2015-12-10 DIAGNOSIS — R110 Nausea: Secondary | ICD-10-CM

## 2015-12-10 MED ORDER — FLUTICASONE PROPIONATE 50 MCG/ACT NA SUSP: 2.0000 | Freq: Every day | NASAL | 3 refills | Status: DC

## 2015-12-10 NOTE — Progress Notes (Signed)
Pre visit review using our clinic review tool, if applicable. No additional management support is needed unless otherwise documented below in the visit note. 

## 2015-12-10 NOTE — Patient Instructions (Signed)
Glad you are doing better and have surgery set up  Best of luck with eye surgery tomorrow

## 2015-12-10 NOTE — Progress Notes (Signed)
Subjective:  Brett Ross is a 70 y.o. year old very pleasant male patient who presents for/with See problem oriented charting ROS- no vomiting, constipation, diarrhea. No headache or blurry vision. .see any ROS included in HPI as well.   Past Medical History-  Patient Active Problem List   Diagnosis Date Noted  . Hyperlipidemia 02/06/2007    Priority: Medium  . Palpitations 11/22/2013    Medications- reviewed and updated Current Outpatient Prescriptions  Medication Sig Dispense Refill  . aspirin 81 MG tablet Take 81 mg by mouth daily.      Marland Kitchen atorvastatin (LIPITOR) 10 MG tablet Take 1 tablet by mouth  daily 90 tablet 3  . Calcium Carbonate-Vit D-Min (CALTRATE 600+D PLUS) 600-400 MG-UNIT per tablet Chew 1 tablet by mouth daily.      . fluticasone (FLONASE) 50 MCG/ACT nasal spray Place 2 sprays into both nostrils daily. 48 g 3  . moxifloxacin (VIGAMOX) 0.5 % ophthalmic solution Place 1 drop into the right eye 4 (four) times daily.    . Multiple Vitamins-Minerals (CENTRUM SILVER PO) Take by mouth daily.      . Omega-3 Fatty Acids (SALMON OIL-1000) 200 MG CAPS Take by mouth daily.       No current facility-administered medications for this visit.     Objective: BP 130/78 (BP Location: Left Arm, Patient Position: Sitting, Cuff Size: Large)   Pulse 61   Temp 97.4 F (36.3 C) (Oral)   Wt 170 lb 12.8 oz (77.5 kg)   SpO2 96%   BMI 27.57 kg/m  Gen: NAD, resting comfortably CV: RRR no murmurs rubs or gallops Lungs: CTAB no crackles, wheeze, rhonchi Abdomen: soft/nontender/nondistended/normal bowel sounds. No rebound or guarding.  Ext: no edema Skin: warm, dry Neuro: grossly normal, moves all extremities  Assessment/Plan:  Bilateral groin Pain  Upset stomach S: Seen about 2 weeks ago by Dr. Regis Bill. Labs largely normal. CT showed fat containing hernia- patient referred to surgery and is set up for within a month. Issues started with sudden lateral movement to shoo geese and  worsens with any similar movement. Per CT fat containing Hernia  2.3 on left, 1.7 on right.  possible acute cystitis on CT but patient largely asymptomatic. Offered culture- declines.   In regards to upset stomach, states seems to have been worse with stress caring/Lifting wife, taking care of dying dog. Cataract surgery tomorrow AM R eye. Denies abdominal pain. Wonders if foods are trigger such as milk. Probiotic helps some. He is overall improving  A/P: Patient presents for follow up but primarily needs counseling for stressors. He is already set up for surgery and taking it easy until that time for bilateral hernias. Stomach upset/nausea improving- we discussed a few trials for his diet.   The duration of face-to-face time during this visit was 20 minutes. Greater than 50% of this time was spent in counseling, explanation of diagnosis, planning of further management, and/or coordination of care.   Meds ordered this encounter  Medications  . moxifloxacin (VIGAMOX) 0.5 % ophthalmic solution    Sig: Place 1 drop into the right eye 4 (four) times daily.  . fluticasone (FLONASE) 50 MCG/ACT nasal spray    Sig: Place 2 sprays into both nostrils daily.    Dispense:  48 g    Refill:  3    Please give 90 day supply    Return precautions advised.  Garret Reddish, MD

## 2016-01-26 ENCOUNTER — Ambulatory Visit (INDEPENDENT_AMBULATORY_CARE_PROVIDER_SITE_OTHER): Payer: 59 | Admitting: Family Medicine

## 2016-01-26 DIAGNOSIS — Z23 Encounter for immunization: Secondary | ICD-10-CM

## 2016-01-28 ENCOUNTER — Ambulatory Visit: Payer: 59 | Admitting: Internal Medicine

## 2016-05-25 ENCOUNTER — Encounter: Payer: Self-pay | Admitting: Family Medicine

## 2016-05-25 ENCOUNTER — Ambulatory Visit (INDEPENDENT_AMBULATORY_CARE_PROVIDER_SITE_OTHER): Payer: 59 | Admitting: Family Medicine

## 2016-05-25 VITALS — BP 120/70 | HR 69 | Temp 98.1°F | Ht 66.0 in | Wt 177.5 lb

## 2016-05-25 DIAGNOSIS — B9789 Other viral agents as the cause of diseases classified elsewhere: Secondary | ICD-10-CM

## 2016-05-25 DIAGNOSIS — H6982 Other specified disorders of Eustachian tube, left ear: Secondary | ICD-10-CM | POA: Diagnosis not present

## 2016-05-25 DIAGNOSIS — J069 Acute upper respiratory infection, unspecified: Secondary | ICD-10-CM | POA: Diagnosis not present

## 2016-05-25 NOTE — Patient Instructions (Signed)
INSTRUCTIONS FOR UPPER RESPIRATORY INFECTION:  -plenty of rest and fluids  -nasal saline wash 2-3 times daily (use prepackaged nasal saline or bottled/distilled water if making your own)   -can use tylenol (in no history of liver disease) or ibuprofen (if no history of kidney disease, bowel bleeding or significant heart disease) as directed for aches and sorethroat  -in the winter time, using a humidifier at night is helpful (please follow cleaning instructions)  -if you are taking a cough medication - use only as directed, may also try a teaspoon of honey to coat the throat and throat lozenges.   -for sore throat, salt water gargles can help  -follow up if you have fevers, facial pain, tooth pain, difficulty breathing or are worsening or symptoms persist longer then expected  Upper Respiratory Infection, Adult An upper respiratory infection (URI) is also known as the common cold. It is often caused by a type of germ (virus). Colds are easily spread (contagious). You can pass it to others by kissing, coughing, sneezing, or drinking out of the same glass. Usually, you get better in 1 to 3  weeks.  However, the cough can last for even longer. HOME CARE   Only take medicine as told by your doctor. Follow instructions provided above.  Drink enough water and fluids to keep your pee (urine) clear or pale yellow.  Get plenty of rest.  Return to work when your temperature is < 100 for 24 hours or as told by your doctor. You may use a face mask and wash your hands to stop your cold from spreading. GET HELP RIGHT AWAY IF:   After the first few days, you feel you are getting worse.  You have questions about your medicine.  You have chills, shortness of breath, or red spit (mucus).  You have pain in the face for more then 1-2 days, especially when you bend forward.  You have a fever, puffy (swollen) neck, pain when you swallow, or white spots in the back of your throat.  You have a bad  headache, ear pain, sinus pain, or chest pain.  You have a high-pitched whistling sound when you breathe in and out (wheezing).  You cough up blood.  You have sore muscles or a stiff neck. MAKE SURE YOU:   Understand these instructions.  Will watch your condition.  Will get help right away if you are not doing well or get worse. Document Released: 10/06/2007 Document Revised: 07/12/2011 Document Reviewed: 07/25/2013 [employer] Patient Information 2015 [employer]. This information is not intended to replace advice given to you by your health care provider. Make sure you discuss any questions you have with your health care provider.  

## 2016-05-25 NOTE — Progress Notes (Signed)
Pre visit review using our clinic review tool, if applicable. No additional management support is needed unless otherwise documented below in the visit note. 

## 2016-05-25 NOTE — Addendum Note (Signed)
Addended by: Lucretia Kern on: 05/25/2016 08:23 PM   Modules accepted: Level of Service

## 2016-05-25 NOTE — Progress Notes (Signed)
HPI:  Acute visit for URI: -started: 2-3 days ago -symptoms:nasal congestion, sore throat, cough, mild body aches -denies:fever, SOB, NVD, tooth pain, sinus pain -has tried: musinex -sick contacts/travel/risks: no reported flu, strep or tick exposure -Hx of: allergies ROS: See pertinent positives and negatives per HPI.  Past Medical History:  Diagnosis Date  . Arthritis   . Detached retina    L 04/2014.   . Environmental allergies   . HYPERLIPIDEMIA 02/06/2007    Past Surgical History:  Procedure Laterality Date  . cataract surgery     01/09/15 left  . KNEE ARTHROSCOPY  2007   left  . VITRECTOMY     06/2014    Family History  Problem Relation Age of Onset  . Heart failure Mother     CHF and heart disease; had breast cancer in 2s  . Leukemia Father   . Colon cancer Neg Hx   . Rectal cancer Neg Hx   . Stomach cancer Neg Hx   . Melanoma Brother   . Heart disease Brother 85    mi, stent  . Heart disease Brother 31    MI-required stent    Social History   Social History  . Marital status: Married    Spouse name: N/A  . Number of children: N/A  . Years of education: N/A   Social History Main Topics  . Smoking status: Never Smoker  . Smokeless tobacco: Never Used  . Alcohol use 4.2 oz/week    7 Cans of beer per week  . Drug use: No  . Sexual activity: Not Asked   Other Topics Concern  . 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    Married and has a step son.    Enjoys family time     Current Outpatient Prescriptions:  .  aspirin 81 MG tablet, Take 81 mg by mouth daily.  , Disp: , Rfl:  .  atorvastatin (LIPITOR) 10 MG tablet, Take 1 tablet by mouth  daily, Disp: 90 tablet, Rfl: 3 .  Calcium Carbonate-Vit D-Min (CALTRATE 600+D PLUS) 600-400 MG-UNIT per tablet, Chew 1 tablet by mouth daily.  , Disp: , Rfl:  .   fluticasone (FLONASE) 50 MCG/ACT nasal spray, Place 2 sprays into both nostrils daily., Disp: 48 g, Rfl: 3 .  Multiple Vitamins-Minerals (CENTRUM SILVER PO), Take by mouth daily.  , Disp: , Rfl:  .  Omega-3 Fatty Acids (SALMON OIL-1000) 200 MG CAPS, Take by mouth daily.  , Disp: , Rfl:   EXAM:  Vitals:   05/25/16 1640  BP: 120/70  Pulse: 69  Temp: 98.1 F (36.7 C)    Body mass index is 28.65 kg/m.  GENERAL: vitals reviewed and listed above, alert, oriented, appears well hydrated and in no acute distress  HEENT: atraumatic, conjunttiva clear, no obvious abnormalities on inspection of external nose and ears, normal appearance of ear canals and TMs except clear effusion L ear, clear nasal congestion, mild post oropharyngeal erythema with PND, no tonsillar edema or exudate, no sinus TTP  NECK: no obvious masses on inspection  LUNGS: clear to auscultation bilaterally, no wheezes, rales or rhonchi, good air movement  CV: HRRR, no peripheral edema  MS: moves all extremities without noticeable abnormality  PSYCH: pleasant and cooperative, no obvious depression or anxiety  ASSESSMENT AND PLAN:  Discussed  the following assessment and plan:  Viral upper respiratory illness  ETD (Eustachian tube dysfunction), left  -given HPI and exam findings today, a serious infection or illness is unlikely. We discussed potential etiologies, with VURI being most likely, and advised supportive care and monitoring.  We discussed treatment side effects, likely course, antibiotic misuse, transmission, and signs of developing a serious illness. He does not have a fever and is out of optimal treatment window for tamiflu - but discussed potential for very mild flu, treatment options, potential complications. He opted against testing or tamiflu. Of course, we advised to return or notify a doctor immediately if symptoms worsen or persist or new concerns arise.    Patient Instructions  INSTRUCTIONS FOR  UPPER RESPIRATORY INFECTION:  -plenty of rest and fluids  -nasal saline wash 2-3 times daily (use prepackaged nasal saline or bottled/distilled water if making your own)   -can use tylenol (in no history of liver disease) or ibuprofen (if no history of kidney disease, bowel bleeding or significant heart disease) as directed for aches and sorethroat  -in the winter time, using a humidifier at night is helpful (please follow cleaning instructions)  -if you are taking a cough medication - use only as directed, may also try a teaspoon of honey to coat the throat and throat lozenges.  -for sore throat, salt water gargles can help  -follow up if you have fevers, facial pain, tooth pain, difficulty breathing or are worsening or symptoms persist longer then expected  Upper Respiratory Infection, Adult An upper respiratory infection (URI) is also known as the common cold. It is often caused by a type of germ (virus). Colds are easily spread (contagious). You can pass it to others by kissing, coughing, sneezing, or drinking out of the same glass. Usually, you get better in 1 to 3  weeks.  However, the cough can last for even longer. HOME CARE   Only take medicine as told by your doctor. Follow instructions provided above.  Drink enough water and fluids to keep your pee (urine) clear or pale yellow.  Get plenty of rest.  Return to work when your temperature is < 100 for 24 hours or as told by your doctor. You may use a face mask and wash your hands to stop your cold from spreading. GET HELP RIGHT AWAY IF:   After the first few days, you feel you are getting worse.  You have questions about your medicine.  You have chills, shortness of breath, or red spit (mucus).  You have pain in the face for more then 1-2 days, especially when you bend forward.  You have a fever, puffy (swollen) neck, pain when you swallow, or white spots in the back of your throat.  You have a bad headache, ear pain,  sinus pain, or chest pain.  You have a high-pitched whistling sound when you breathe in and out (wheezing).  You cough up blood.  You have sore muscles or a stiff neck. MAKE SURE YOU:   Understand these instructions.  Will watch your condition.  Will get help right away if you are not doing well or get worse. Document Released: 10/06/2007 Document Revised: 07/12/2011 Document Reviewed: 07/25/2013 [facility] Patient Information 2015 [place], [place]. This information is not intended to replace advice given to you by your health care provider. Make sure you discuss any questions you have with your health care provider.    Colin Benton R., DO

## 2016-08-02 ENCOUNTER — Other Ambulatory Visit: Payer: Self-pay | Admitting: Family Medicine

## 2016-09-08 ENCOUNTER — Ambulatory Visit (INDEPENDENT_AMBULATORY_CARE_PROVIDER_SITE_OTHER): Payer: 59 | Admitting: Family Medicine

## 2016-09-08 ENCOUNTER — Encounter: Payer: Self-pay | Admitting: Family Medicine

## 2016-09-08 VITALS — BP 140/80 | HR 67 | Temp 98.2°F | Ht 66.0 in | Wt 172.6 lb

## 2016-09-08 DIAGNOSIS — J208 Acute bronchitis due to other specified organisms: Secondary | ICD-10-CM

## 2016-09-08 DIAGNOSIS — R52 Pain, unspecified: Secondary | ICD-10-CM | POA: Diagnosis not present

## 2016-09-08 LAB — POC INFLUENZA A&B (BINAX/QUICKVUE)
INFLUENZA A, POC: NEGATIVE
INFLUENZA B, POC: NEGATIVE

## 2016-09-08 MED ORDER — PREDNISONE 20 MG PO TABS: ORAL_TABLET | ORAL | 0 refills | Status: DC

## 2016-09-08 MED ORDER — ALBUTEROL SULFATE HFA 108 (90 BASE) MCG/ACT IN AERS: 2.0000 | INHALATION_SPRAY | Freq: Four times a day (QID) | RESPIRATORY_TRACT | 2 refills | Status: DC | PRN

## 2016-09-08 MED ORDER — GUAIFENESIN-CODEINE 100-10 MG/5ML PO SOLN
5.0000 mL | Freq: Four times a day (QID) | ORAL | 0 refills | Status: DC | PRN
Start: 2016-09-08 — End: 2016-12-14

## 2016-09-08 NOTE — Progress Notes (Signed)
PCP: Marin Olp, MD  Subjective:  Brett Ross is a 71 y.o. year old very pleasant male patient who presents with  symptoms including nasal congestion, cough, chest congestion, burning in throat and into chest with cough, headache.  alos has had sinus pressure and yellow drainage for 4 days. Coughing up brown sputum. Some difficulty resting at night. Has some body aches as well.  - does  have wheeze as well as some mild shortness of breath.  -started: 6 days ago, symptoms show no change -previous treatments: advil helps with body aches.  -sick contacts/travel/risks: denies flu exposure.  Hx of allergies - just started zyrtec back which helps- has not been on flonase for some time.   ROS-denies fever, NVD, tooth pain. Denies significant shortness of breath.   Pertinent Past Medical History-  Patient Active Problem List   Diagnosis Date Noted  . Hyperlipidemia 02/06/2007    Priority: Medium  . Palpitations 11/22/2013    Medications- reviewed  Current Outpatient Prescriptions  Medication Sig Dispense Refill  . aspirin 81 MG tablet Take 81 mg by mouth daily.      Marland Kitchen atorvastatin (LIPITOR) 10 MG tablet TAKE 1 TABLET BY MOUTH  DAILY 90 tablet 3  . Calcium Carbonate-Vit D-Min (CALTRATE 600+D PLUS) 600-400 MG-UNIT per tablet Chew 1 tablet by mouth daily.      . fluticasone (FLONASE) 50 MCG/ACT nasal spray Place 2 sprays into both nostrils daily. 48 g 3  . Multiple Vitamins-Minerals (CENTRUM SILVER PO) Take by mouth daily.      . Omega-3 Fatty Acids (SALMON OIL-1000) 200 MG CAPS Take by mouth daily.      Marland Kitchen albuterol (PROVENTIL HFA;VENTOLIN HFA) 108 (90 Base) MCG/ACT inhaler Inhale 2 puffs into the lungs every 6 (six) hours as needed for wheezing or shortness of breath. 1 Inhaler 2  . guaiFENesin-codeine 100-10 MG/5ML syrup Take 5 mLs by mouth every 6 (six) hours as needed for cough. 120 mL 0  . predniSONE (DELTASONE) 20 MG tablet Take 2 pills for 3 days, 1 pill for 4 days 10 tablet  0   No current facility-administered medications for this visit.     Objective: BP 140/80 (BP Location: Left Arm, Patient Position: Sitting, Cuff Size: Large)   Pulse 67   Temp 98.2 F (36.8 C) (Oral)   Ht 5\' 6"  (1.676 m)   Wt 172 lb 9.6 oz (78.3 kg)   SpO2 96%   BMI 27.86 kg/m  Gen: NAD, resting comfortably HEENT: Turbinates erythematous with yellow discharge, TM normal, pharynx mildly erythematous with no tonsilar exudate or edema, no sinus tenderness CV: RRR no murmurs rubs or gallops Lungs: bilaterally prominent rhonchi and wheeze but no crackes Ext: no edema Skin: warm, dry, no rash  Results for orders placed or performed in visit on 09/08/16 (from the past 24 hour(s))  POC Influenza A&B(BINAX/QUICKVUE)     Status: None   Collection Time: 09/08/16 11:59 AM  Result Value Ref Range   Influenza A, POC Negative Negative   Influenza B, POC Negative Negative    Assessment/Plan:  Bronchitis History and exam today are suggestive of viral infection most likely due to bronchitis. Flu testing negative Symptomatic treatment with: albuterol for wheezing, codeine for cough- do not drive for 8 hours after this Discussed prednisone:opted in  We discussed that we did not find any infection that had higher probability of being bacterial such as pneumonia, strep throat, ear infection, bacterial sinusitis. We discussed signs that bacterial infection may  have developed particularly fever or shortness of breath and reasons for follow up (symptoms worsen, last past expected time frame, new concerns arise).  Likely course of 3-6 weeks. Patient is contagious and advised good handwashing and consideration of mask If going to be in public places.   We did discuss with 4 days of sinus symptoms- potential would need antibiotic for bacterial sinusitis if symptoms last over a week.   Meds ordered this encounter  Medications  . guaiFENesin-codeine 100-10 MG/5ML syrup    Sig: Take 5 mLs by mouth  every 6 (six) hours as needed for cough.    Dispense:  120 mL    Refill:  0  . albuterol (PROVENTIL HFA;VENTOLIN HFA) 108 (90 Base) MCG/ACT inhaler    Sig: Inhale 2 puffs into the lungs every 6 (six) hours as needed for wheezing or shortness of breath.    Dispense:  1 Inhaler    Refill:  2  . predniSONE (DELTASONE) 20 MG tablet    Sig: Take 2 pills for 3 days, 1 pill for 4 days    Dispense:  10 tablet    Refill:  0  new acute issues. Slightly higher risk patient with age and hyperlipidemia.  Garret Reddish, MD

## 2016-09-08 NOTE — Patient Instructions (Signed)
Bronchitis History and exam today are suggestive of viral infection most likely due to bronchitis. Symptomatic treatment with: albuterol for wheezing, codeine for cough- do not drive for 8 hours after this Discussed prednisone:opted in  We discussed that we did not find any infection that had higher probability of being bacterial such as pneumonia, strep throat, ear infection, bacterial sinusitis. We discussed signs that bacterial infection may have developed particularly fever or shortness of breath and reasons for follow up (symptoms worsen, last past expected time frame, new concerns arise).  Likely course of 3-6 weeks. Patient is contagious and advised good handwashing and consideration of mask If going to be in public places.   Meds ordered this encounter  Medications  . guaiFENesin-codeine 100-10 MG/5ML syrup    Sig: Take 5 mLs by mouth every 6 (six) hours as needed for cough.    Dispense:  120 mL    Refill:  0  . albuterol (PROVENTIL HFA;VENTOLIN HFA) 108 (90 Base) MCG/ACT inhaler    Sig: Inhale 2 puffs into the lungs every 6 (six) hours as needed for wheezing or shortness of breath.    Dispense:  1 Inhaler    Refill:  2  . predniSONE (DELTASONE) 20 MG tablet    Sig: Take 2 pills for 3 days, 1 pill for 4 days    Dispense:  10 tablet    Refill:  0

## 2016-12-14 ENCOUNTER — Encounter: Payer: Self-pay | Admitting: Family Medicine

## 2016-12-14 ENCOUNTER — Ambulatory Visit (INDEPENDENT_AMBULATORY_CARE_PROVIDER_SITE_OTHER): Payer: 59 | Admitting: Family Medicine

## 2016-12-14 VITALS — BP 118/80 | HR 64 | Temp 97.6°F | Ht 66.5 in | Wt 172.4 lb

## 2016-12-14 DIAGNOSIS — Z125 Encounter for screening for malignant neoplasm of prostate: Secondary | ICD-10-CM

## 2016-12-14 DIAGNOSIS — Z0000 Encounter for general adult medical examination without abnormal findings: Secondary | ICD-10-CM | POA: Diagnosis not present

## 2016-12-14 DIAGNOSIS — E785 Hyperlipidemia, unspecified: Secondary | ICD-10-CM

## 2016-12-14 LAB — CBC
HCT: 46.4 % (ref 39.0–52.0)
Hemoglobin: 16 g/dL (ref 13.0–17.0)
MCHC: 34.6 g/dL (ref 30.0–36.0)
MCV: 93.8 fl (ref 78.0–100.0)
PLATELETS: 249 10*3/uL (ref 150.0–400.0)
RBC: 4.95 Mil/uL (ref 4.22–5.81)
RDW: 13.6 % (ref 11.5–15.5)
WBC: 7.1 10*3/uL (ref 4.0–10.5)

## 2016-12-14 LAB — COMPREHENSIVE METABOLIC PANEL
ALBUMIN: 4.7 g/dL (ref 3.5–5.2)
ALT: 21 U/L (ref 0–53)
AST: 19 U/L (ref 0–37)
Alkaline Phosphatase: 48 U/L (ref 39–117)
BUN: 12 mg/dL (ref 6–23)
CHLORIDE: 104 meq/L (ref 96–112)
CO2: 30 meq/L (ref 19–32)
CREATININE: 0.98 mg/dL (ref 0.40–1.50)
Calcium: 10 mg/dL (ref 8.4–10.5)
GFR: 80.51 mL/min (ref >60.00)
Glucose, Bld: 91 mg/dL (ref 70–99)
Potassium: 4.4 mEq/L (ref 3.5–5.1)
SODIUM: 142 meq/L (ref 135–145)
Total Bilirubin: 0.9 mg/dL (ref 0.2–1.2)
Total Protein: 6.4 g/dL (ref 6.0–8.3)

## 2016-12-14 LAB — LIPID PANEL
CHOL/HDL RATIO: 3
CHOLESTEROL: 171 mg/dL (ref 0–200)
HDL: 58.3 mg/dL (ref >39.00)
LDL CALC: 87 mg/dL (ref 0–99)
NonHDL: 112.92
Triglycerides: 130 mg/dL (ref 0.0–149.0)
VLDL: 26 mg/dL (ref 0.0–40.0)

## 2016-12-14 LAB — PSA: PSA: 1.27 ng/mL (ref 0.10–4.00)

## 2016-12-14 NOTE — Patient Instructions (Addendum)
Please stop by lab before you go  No changes today  Hopefully can give you shingrix at next years physical. Thanks for getting yoru flu shot each fall.

## 2016-12-14 NOTE — Assessment & Plan Note (Signed)
HLD- update lipids on atorvastatin 10mg 

## 2016-12-14 NOTE — Progress Notes (Signed)
Phone: [phone]  Subjective:  Patient presents today for their annual physical. Chief complaint-noted.   See problem oriented charting- ROS- full  review of systems was completed and negative except for: runny nose at times despite medication. No chest pain or shortness of breath. No headache or blurry vision.   The following were reviewed and entered/updated in epic: Past Medical History:  Diagnosis Date  . Arthritis   . Detached retina    L 04/2014.   . Environmental allergies   . HYPERLIPIDEMIA 02/06/2007   Patient Active Problem List   Diagnosis Date Noted  . Hyperlipidemia 02/06/2007    Priority: Medium  . Palpitations 11/22/2013   Past Surgical History:  Procedure Laterality Date  . cataract surgery     01/09/15 left. right 2018.   . INGUINAL HERNIA REPAIR     bilateral  . KNEE ARTHROSCOPY  2007   left  . VITRECTOMY     06/2014 left   Family History  Problem Relation Age of Onset  . Melanoma Brother   . Heart disease Brother 31       mi, stent  . Heart disease Brother 18       MI-required stent  . Heart failure Mother        CHF and heart disease; had breast cancer in 84s  . Leukemia Father   . Colon cancer Neg Hx   . Rectal cancer Neg Hx   . Stomach cancer Neg Hx     Medications- reviewed and updated Current Outpatient Prescriptions  Medication Sig Dispense Refill  . aspirin 81 MG tablet Take 81 mg by mouth daily.      Marland Kitchen atorvastatin (LIPITOR) 10 MG tablet TAKE 1 TABLET BY MOUTH  DAILY 90 tablet 3  . Calcium Carbonate-Vit D-Min (CALTRATE 600+D PLUS) 600-400 MG-UNIT per tablet Chew 1 tablet by mouth daily.      . fluticasone (FLONASE) 50 MCG/ACT nasal spray Place 2 sprays into both nostrils daily. 48 g 3  . Multiple Vitamins-Minerals (CENTRUM SILVER PO) Take by mouth daily.      . Omega-3 Fatty Acids (SALMON OIL-1000) 200 MG CAPS Take by mouth daily.       No current facility-administered medications for this visit.     Allergies-reviewed and  updated Allergies  Allergen Reactions  . Caffeine     REACTION: tinnitis  . Sulfites     Per patient he has difficulty sleeping    Social History   Social History  . Marital status: Married    Spouse name: N/A  . Number of children: N/A  . Years of education: N/A   Social History Main Topics  . Smoking status: Never Smoker  . Smokeless tobacco: Never Used  . Alcohol use 4.2 oz/week    7 Cans of beer per week  . Drug use: No  . Sexual activity: Not Asked   Other Topics Concern  . None   Social History Narrative   Married and has a step son.       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       Enjoys family time    Objective: BP 118/80 (BP Location: Left Arm, Patient Position: Sitting, Cuff Size: Large)   Pulse 64   Temp 97.6 F (36.4 C) (Oral)   Ht 5' 6.5" (1.689 m)   Wt 172 lb 6.4 oz (78.2  kg)   SpO2 96%   BMI 27.41 kg/m  Gen: NAD, resting comfortably HEENT: Mucous membranes are moist. Oropharynx normal Neck: no thyromegaly CV: RRR no murmurs rubs or gallops Lungs: CTAB no crackles, wheeze, rhonchi Abdomen: soft/nontender/nondistended/normal bowel sounds. No rebound or guarding.  Ext: no edema Skin: warm, dry Neuro: grossly normal, moves all extremities, PERRLA Rectal: normal tone, normal sized prostate, no masses or tenderness  Assessment/Plan:  71 y.o. male presenting for annual physical.  Health Maintenance counseling: 1. Anticipatory guidance: Patient counseled regarding regular dental exams q6 months, eye exams - regular visits, wearing seatbelts.  2. Risk factor reduction:  Advised patient of need for regular exercise and diet rich and fruits and vegetables to reduce risk of heart attack and stroke. Exercise- walks behind mower self propel once a week but otherwise no exercise- advised regular exercise 150 minutes a week. Diet-has lost 5 lbs from  January- states would like to lose 15 more Wt Readings from Last 3 Encounters:  12/14/16 172 lb 6.4 oz (78.2 kg)  09/08/16 172 lb 9.6 oz (78.3 kg)  05/25/16 177 lb 8 oz (80.5 kg)  3. Immunizations/screenings/ancillary studies- advised flu shot in the fall. discussed shingrix at pharmacy Immunization History  Administered Date(s) Administered  . H1N1 04/16/2008  . Influenza Whole 02/01/2008, 02/04/2009, 02/01/2011  . Influenza, High Dose Seasonal PF 01/26/2016  . Influenza,inj,Quad PF,36+ Mos 01/17/2015  . Influenza-Unspecified 02/14/2014  . Pneumococcal Conjugate-13 01/17/2015  . Pneumococcal Polysaccharide-23 09/18/2012  . Td 10/07/2008  . Zoster 06/11/2011  4. Prostate cancer screening-  low risk PSA trend in past- update today and low risk rectal- large normal and nocturia 2-3x a night- would not be surprised if PSA up some Lab Results  Component Value Date   PSA 0.94 01/14/2015   PSA 1.01 09/18/2012   PSA 0.73 06/03/2011   5. Colon cancer screening - 02/2011 with 10 year repeat 6. Skin cancer screening- advised regular sunscreen, low exposure though. Waist up exam today without obvious cancerous or precancerous lesions. Does not see dermatology  Status of chronic or acute concerns   Palpitations in the past- reassuring stress test 2015. Now only notes if lays on left side- resolves if rolls onto right  Allergic rhinitis- takes year round antihistamine and switches between products.   Caregiver burden- still working as wife with chronic illness and wants to be able to afford her care  Hyperlipidemia HLD- update lipids- on atorvastatin 10mg   1 year CPE  Orders Placed This Encounter  Procedures  . CBC    [place]  . Comprehensive metabolic panel    [place]    Order Specific Question:   Has the patient fasted?    Answer:   No  . Lipid panel    [place]    Order Specific Question:   Has the patient fasted?    Answer:   No  . PSA   Return precautions advised.    Garret Reddish, MD

## 2017-01-17 ENCOUNTER — Ambulatory Visit (INDEPENDENT_AMBULATORY_CARE_PROVIDER_SITE_OTHER): Payer: 59 | Admitting: General Practice

## 2017-01-17 DIAGNOSIS — Z23 Encounter for immunization: Secondary | ICD-10-CM | POA: Diagnosis not present

## 2017-06-26 ENCOUNTER — Other Ambulatory Visit: Payer: Self-pay | Admitting: Family Medicine

## 2017-12-16 ENCOUNTER — Ambulatory Visit (INDEPENDENT_AMBULATORY_CARE_PROVIDER_SITE_OTHER): Payer: 59 | Admitting: Family Medicine

## 2017-12-16 ENCOUNTER — Encounter: Payer: Self-pay | Admitting: Family Medicine

## 2017-12-16 VITALS — BP 120/78 | HR 65 | Temp 98.4°F | Ht 66.5 in | Wt 169.8 lb

## 2017-12-16 DIAGNOSIS — Z0000 Encounter for general adult medical examination without abnormal findings: Secondary | ICD-10-CM

## 2017-12-16 DIAGNOSIS — Z125 Encounter for screening for malignant neoplasm of prostate: Secondary | ICD-10-CM

## 2017-12-16 DIAGNOSIS — E785 Hyperlipidemia, unspecified: Secondary | ICD-10-CM | POA: Diagnosis not present

## 2017-12-16 DIAGNOSIS — R002 Palpitations: Secondary | ICD-10-CM

## 2017-12-16 MED ORDER — ATORVASTATIN CALCIUM 10 MG PO TABS: 10.0000 mg | ORAL_TABLET | Freq: Every day | ORAL | 3 refills | Status: DC

## 2017-12-16 NOTE — Progress Notes (Signed)
Phone: [phone]  Subjective:  Patient presents today for their annual physical. Chief complaint-noted.   See problem oriented charting- ROS- full  review of systems was completed and negative except for: tinnitus, abdominal pain, joint pain, seasonal allergies  The following were reviewed and entered/updated in epic: Past Medical History:  Diagnosis Date  . Arthritis   . Detached retina    L 04/2014.   . Environmental allergies   . HYPERLIPIDEMIA 02/06/2007   Patient Active Problem List   Diagnosis Date Noted  . Hyperlipidemia 02/06/2007    Priority: Medium  . Palpitations 11/22/2013   Past Surgical History:  Procedure Laterality Date  . cataract surgery     01/09/15 left. right 2018.   . INGUINAL HERNIA REPAIR     bilateral  . KNEE ARTHROSCOPY  2007   left  . VITRECTOMY     06/2014 left  . VITRECTOMY     10/2017    Family History  Problem Relation Age of Onset  . Melanoma Brother   . Heart disease Brother 32       mi, stent  . Heart disease Brother 67       MI-required stent  . Heart failure Mother        CHF and heart disease; had breast cancer in 39s  . Leukemia Father   . Colon cancer Neg Hx   . Rectal cancer Neg Hx   . Stomach cancer Neg Hx     Medications- reviewed and updated Current Outpatient Medications  Medication Sig Dispense Refill  . atorvastatin (LIPITOR) 10 MG tablet Take 1 tablet (10 mg total) by mouth daily. 90 tablet 3  . Calcium Carbonate-Vit D-Min (CALTRATE 600+D PLUS) 600-400 MG-UNIT per tablet Chew 1 tablet by mouth daily.      . fluticasone (FLONASE) 50 MCG/ACT nasal spray Place 2 sprays into both nostrils daily. 48 g 3  . Multiple Vitamins-Minerals (CENTRUM SILVER PO) Take by mouth daily.      . Omega-3 Fatty Acids (SALMON OIL-1000) 200 MG CAPS Take by mouth daily.       No current facility-administered medications for this visit.     Allergies-reviewed and updated Allergies  Allergen Reactions  . Caffeine     REACTION:  tinnitis  . Sulfites     Per patient he has difficulty sleeping    Social History   Social History Narrative   Married and has a step son.       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       Enjoys family time    Objective: BP 120/78 (BP Location: Left Arm, Patient Position: Sitting, Cuff Size: Large)   Pulse 65   Temp 98.4 F (36.9 C) (Oral)   Ht 5' 6.5" (1.689 m)   Wt 169 lb 12.8 oz (77 kg)   SpO2 96%   BMI 27.00 kg/m  Gen: NAD, resting comfortably HEENT: Mucous membranes are moist. Oropharynx normal Neck: no thyromegaly CV: RRR no murmurs rubs or gallops Lungs: CTAB no crackles, wheeze, rhonchi Abdomen: soft/nontender/nondistended/normal bowel sounds. No rebound or guarding.  Ext: no edema Skin: warm, dry Neuro: grossly normal, moves all extremities, PERRLA Rectal: normal tone, normal sized prostate (upper limits), no masses or tenderness   Assessment/Plan:  72 y.o. male presenting for annual physical.  Health Maintenance counseling: 1. Anticipatory guidance: Patient counseled regarding regular dental exams -  q6 months, eye exams -yearly at least, wearing seatbelts.  2. Risk factor reduction:  Advised patient of need for regular exercise and diet rich and fruits and vegetables to reduce risk of heart attack and stroke. Exercise- time limited due to caring for wife- except for some mowing. Diet-trying to eat well and down a few lbs.  Wt Readings from Last 3 Encounters:  12/16/17 169 lb 12.8 oz (77 kg)  12/14/16 172 lb 6.4 oz (78.2 kg)  09/08/16 172 lb 9.6 oz (78.3 kg)  3. Immunizations/screenings/ancillary studies- fall flu shot advised. shingrix discussed - will put on wait list Immunization History  Administered Date(s) Administered  . H1N1 04/16/2008  . Influenza Whole 02/01/2008, 02/04/2009, 02/01/2011  . Influenza, High Dose Seasonal PF 01/26/2016,  01/17/2017  . Influenza,inj,Quad PF,6+ Mos 01/17/2015  . Influenza-Unspecified 02/14/2014  . Pneumococcal Conjugate-13 01/17/2015  . Pneumococcal Polysaccharide-23 09/18/2012  . Td 10/07/2008  . Zoster 06/11/2011   4. Prostate cancer screening- Prior low risk PSA trend. Update today. Large normal prostate and nocturia 2-3x a night (stable). Likely pees more at night just because he is up helping his wife urinate.  Lab Results  Component Value Date   PSA 1.27 12/14/2016   PSA 0.94 01/14/2015   PSA 1.01 09/18/2012   5. Colon cancer screening - 02/2011 with 10 year repeat 6. Skin cancer screening- no dermatologist. advised regular sunscreen use. Denies worrisome, changing, or new skin lesions.   Status of chronic or acute concerns   Home health discussed for his wife. He spends an hour each morning getting her dressed. Hard to get her up the stiars in their home- he ends up hurting himself- ends up with left arm pain after lifting (hard to left left arm)- he plans to see Dr. French Ana.   He is dealing with left knee arthritis still.   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 Then had one on right and had to have vitrectomy this year. He is healing well  HLD- continue atorvastatin 77m. Update lipids today Lab Results  Component Value Date   CHOL 171 12/14/2016   HDL 58.30 12/14/2016   LDLCALC 87 12/14/2016   TRIG 130.0 12/14/2016   CHOLHDL 3 12/14/2016   Still with palpitations if lays on left side, resolves when rolls onto right. Prior cardiac workup including stress test 2015.   Long term issues with tinnitus- not getting worse and not pulsatile.   Allergic rhinitis- switches antihistamines as needed- mainly zyrtec lately.    Lab/Order associations: Preventative health care - Plan: CBC, Comprehensive metabolic panel, Lipid panel, PSA  Palpitations  Hyperlipidemia, unspecified  hyperlipidemia type - Plan: CBC, Comprehensive metabolic panel, Lipid panel  Screening for prostate cancer - Plan: PSA  Meds ordered this encounter  Medications  . atorvastatin (LIPITOR) 10 MG tablet    Sig: Take 1 tablet (10 mg total) by mouth daily.    Dispense:  90 tablet    Refill:  3    Return precautions advised.  Garret Reddish, MD

## 2017-12-16 NOTE — Patient Instructions (Addendum)
Health Maintenance Due  Topic Date Due  . INFLUENZA VACCINE -Schedule for this Fall 12/01/2017   Schedule a lab visit at the check out desk within 2 weeks. Return for future fasting labs meaning nothing but water after midnight please. Ok to take your medications with water.   No changes today

## 2017-12-19 ENCOUNTER — Other Ambulatory Visit (INDEPENDENT_AMBULATORY_CARE_PROVIDER_SITE_OTHER): Payer: 59

## 2017-12-19 DIAGNOSIS — Z125 Encounter for screening for malignant neoplasm of prostate: Secondary | ICD-10-CM | POA: Diagnosis not present

## 2017-12-19 DIAGNOSIS — E785 Hyperlipidemia, unspecified: Secondary | ICD-10-CM

## 2017-12-19 DIAGNOSIS — Z0000 Encounter for general adult medical examination without abnormal findings: Secondary | ICD-10-CM | POA: Diagnosis not present

## 2017-12-19 LAB — COMPREHENSIVE METABOLIC PANEL
ALBUMIN: 4.4 g/dL (ref 3.5–5.2)
ALK PHOS: 52 U/L (ref 39–117)
ALT: 16 U/L (ref 0–53)
AST: 17 U/L (ref 0–37)
BUN: 15 mg/dL (ref 6–23)
CO2: 27 mEq/L (ref 19–32)
Calcium: 9.7 mg/dL (ref 8.4–10.5)
Chloride: 105 mEq/L (ref 96–112)
Creatinine, Ser: 1.14 mg/dL (ref 0.40–1.50)
GFR: 67.42 mL/min (ref >60.00)
Glucose, Bld: 101 mg/dL — ABNORMAL HIGH (ref 70–99)
POTASSIUM: 4 meq/L (ref 3.5–5.1)
Sodium: 141 mEq/L (ref 135–145)
TOTAL PROTEIN: 6.9 g/dL (ref 6.0–8.3)
Total Bilirubin: 1.1 mg/dL (ref 0.2–1.2)

## 2017-12-19 LAB — PSA: PSA: 1.62 ng/mL (ref 0.10–4.00)

## 2017-12-19 LAB — CBC
HCT: 45.7 % (ref 39.0–52.0)
HEMOGLOBIN: 15.8 g/dL (ref 13.0–17.0)
MCHC: 34.5 g/dL (ref 30.0–36.0)
MCV: 91.3 fl (ref 78.0–100.0)
PLATELETS: 265 10*3/uL (ref 150.0–400.0)
RBC: 5 Mil/uL (ref 4.22–5.81)
RDW: 13.6 % (ref 11.5–15.5)
WBC: 7.3 10*3/uL (ref 4.0–10.5)

## 2017-12-19 LAB — LIPID PANEL
CHOLESTEROL: 160 mg/dL (ref 0–200)
HDL: 54.3 mg/dL (ref >39.00)
LDL Cholesterol: 89 mg/dL (ref 0–99)
NONHDL: 106.18
Total CHOL/HDL Ratio: 3
Triglycerides: 86 mg/dL (ref 0.0–149.0)
VLDL: 17.2 mg/dL (ref 0.0–40.0)

## 2017-12-23 ENCOUNTER — Ambulatory Visit (INDEPENDENT_AMBULATORY_CARE_PROVIDER_SITE_OTHER): Payer: 59

## 2017-12-23 DIAGNOSIS — Z23 Encounter for immunization: Secondary | ICD-10-CM | POA: Diagnosis not present

## 2017-12-23 NOTE — Progress Notes (Signed)
Patient here for Shingles shot part 1. Administered in right deltoid by Merry Lofty, CMA. Patient will return in 2-3 months for second part of shingles vaccine. Patient tolerated well. No reaction to injection.

## 2017-12-26 ENCOUNTER — Ambulatory Visit (INDEPENDENT_AMBULATORY_CARE_PROVIDER_SITE_OTHER): Payer: 59 | Admitting: Family Medicine

## 2017-12-26 ENCOUNTER — Ambulatory Visit: Payer: Self-pay

## 2017-12-26 ENCOUNTER — Encounter: Payer: Self-pay | Admitting: Family Medicine

## 2017-12-26 VITALS — BP 130/80 | HR 61 | Temp 97.8°F | Ht 66.5 in | Wt 169.2 lb

## 2017-12-26 DIAGNOSIS — T50Z95A Adverse effect of other vaccines and biological substances, initial encounter: Secondary | ICD-10-CM | POA: Diagnosis not present

## 2017-12-26 DIAGNOSIS — M791 Myalgia, unspecified site: Secondary | ICD-10-CM

## 2017-12-26 DIAGNOSIS — R509 Fever, unspecified: Secondary | ICD-10-CM

## 2017-12-26 MED ORDER — CEPHALEXIN 500 MG PO CAPS: 500.0000 mg | ORAL_CAPSULE | Freq: Three times a day (TID) | ORAL | 0 refills | Status: AC

## 2017-12-26 NOTE — Patient Instructions (Addendum)
Health Maintenance Due  Topic Date Due  . INFLUENZA VACCINE -please call our office to schedule this in October/November 12/01/2017   This is either a severe local reaction or early cellulitis. With how it rapidly expanded over last 24 hours- we are going to give you keflex to use for 7 days- I would start this if you start to feel ill or if it continues to worsen by tomorrow

## 2017-12-26 NOTE — Telephone Encounter (Signed)
Pt. Reports he received the Shingles vaccine Friday and developed flu-like symptoms Friday. Had "some confusion Saturday - I wasn't sure of what day it was." Right deltoid has an area of redness "the size of a tennis ball and it is getting worse." No swelling. Site "is very itchy." Instructed pt. To take adult dose of Benadryl. Appointment made with his provider for today.  Reason for Disposition . [1] Redness or red streak around the injection site AND [2] begins > 48 hours after shot AND [3] no fever  (Exception: red area < 1 inch or 2.5 cm wide)  Answer Assessment - Initial Assessment Questions 1. SYMPTOMS: "What is the main symptom?" (e.g., redness, swelling, pain)      Redness size of a tennis ball and getting better 2. ONSET: "When was the vaccine (shot) given?" "How much later did the __ begin?" (e.g., hours, days ago)      Friday 8/23 3. SEVERITY: "How bad is it?"      Moderate 4. FEVER: "Is there a fever?" If so, ask: "What is it, how was it measured, and when did it start?"      Fever Saturday 98.8 -99  None this morning 5. IMMUNIZATIONS GIVEN: "What shots have you recently received?"     Shingles 6. PAST REACTIONS: "Have you reacted to immunizations before?" If so, ask: "What happened?"     No 7. OTHER SYMPTOMS: "Do you have any other symptoms?"     Friday has flu-like symptoms. Some confusion Saturday. Site is itchy  Protocols used: IMMUNIZATION REACTIONS-A-AH

## 2017-12-26 NOTE — Progress Notes (Signed)
Subjective:  Brett Ross is a 72 y.o. year old very pleasant male patient who presents for/with See problem oriented charting ROS- fever, chills, myalgias on Friday into Saturday- none since that time. No chest pain. Has expanding redness on right arm.    Past Medical History-  Patient Active Problem List   Diagnosis Date Noted  . Hyperlipidemia 02/06/2007    Priority: Medium  . Palpitations 11/22/2013    Medications- reviewed and updated Current Outpatient Medications  Medication Sig Dispense Refill  . atorvastatin (LIPITOR) 10 MG tablet Take 1 tablet (10 mg total) by mouth daily. 90 tablet 3  . Calcium Carbonate-Vit D-Min (CALTRATE 600+D PLUS) 600-400 MG-UNIT per tablet Chew 1 tablet by mouth daily.      . fluticasone (FLONASE) 50 MCG/ACT nasal spray Place 2 sprays into both nostrils daily. 48 g 3  . Multiple Vitamins-Minerals (CENTRUM SILVER PO) Take by mouth daily.      . Omega-3 Fatty Acids (SALMON OIL-1000) 200 MG CAPS Take by mouth daily.       Objective: BP 130/80   Pulse 61   Temp 97.8 F (36.6 C) (Oral)   Ht 5' 6.5" (1.689 m)   Wt 169 lb 3.2 oz (76.7 kg)   SpO2 98%   BMI 26.90 kg/m  Gen: NAD, resting comfortably CV: RRR no murmurs rubs or gallops Lungs: CTAB no crackles, wheeze, rhonchi Ext: no edema Skin: warm, dry, right arm with erythema extending out from site of prior shingrix immunization- warm and erythematous with mild tenderness in this area. No bruising  Neuro:speech  normal, moves all extremities   Area that appears blotchy in picture is the area of most intense erythema- does not photograph well   Assessment/Plan:  Adverse effect of vaccine, initial encounter S: Patient with shingrix shot on 12/23/17. Flu like illness- fever, myalgias for about 24 hours or a little longer. Then on Sunday noted redness on right arm. Overnight redness doubled in size. Mild tenderness noted. Has noted warmth in area. No treatments tried  A/P: from avs "This is  either a severe local reaction or early cellulitis. With how it rapidly expanded over last 24 hours- we are going to give you keflex to use for 7 days- I would start this if you start to feel ill or if it continues to worsen by tomorrow "  We discussed could still have 2nd shot if he so chooses- currently has October visit planned- he is not sure what he will do at this point.   BP up initially but trended down on repeat.  BP Readings from Last 3 Encounters:  12/26/17 130/80  12/16/17 120/78  12/14/16 118/80   Future Appointments  Date Time Provider Eva  02/22/2018  9:00 AM [facility] NURSE [facility]    Meds ordered this encounter  Medications  . cephALEXin (KEFLEX) 500 MG capsule    Sig: Take 1 capsule (500 mg total) by mouth 3 (three) times daily for 7 days.    Dispense:  21 capsule    Refill:  0   Return precautions advised.  Garret Reddish, MD

## 2017-12-26 NOTE — Telephone Encounter (Signed)
See note

## 2017-12-26 NOTE — Telephone Encounter (Signed)
Noted. Pt has appt today 8/26 @ 1pm w/Dr. Yong Channel

## 2018-02-01 ENCOUNTER — Ambulatory Visit (INDEPENDENT_AMBULATORY_CARE_PROVIDER_SITE_OTHER): Payer: 59 | Admitting: Family Medicine

## 2018-02-01 ENCOUNTER — Encounter: Payer: Self-pay | Admitting: Family Medicine

## 2018-02-01 VITALS — BP 128/78 | HR 64 | Temp 98.3°F | Ht 66.5 in | Wt 167.6 lb

## 2018-02-01 DIAGNOSIS — R05 Cough: Secondary | ICD-10-CM | POA: Diagnosis not present

## 2018-02-01 DIAGNOSIS — R059 Cough, unspecified: Secondary | ICD-10-CM

## 2018-02-01 MED ORDER — AZITHROMYCIN 250 MG PO TABS: ORAL_TABLET | ORAL | 0 refills | Status: DC

## 2018-02-01 MED ORDER — BENZONATATE 200 MG PO CAPS: 200.0000 mg | ORAL_CAPSULE | Freq: Two times a day (BID) | ORAL | 0 refills | Status: DC | PRN

## 2018-02-01 MED ORDER — IPRATROPIUM BROMIDE 0.06 % NA SOLN: 2.0000 | Freq: Four times a day (QID) | NASAL | 0 refills | Status: DC

## 2018-02-01 NOTE — Patient Instructions (Signed)
Start the atrovent.  Start tessalon for your cough.  Start the zpack if your symptoms worsen or do not improve in a few days.  Please stay well hydrated.  You can take tylenol and/or motrin as needed for low grade fever and pain.  Please let me know if your symptoms worsen or fail to improve.  Take care, Dr Jerline Pain

## 2018-02-01 NOTE — Progress Notes (Signed)
   Subjective:  Brett Ross is a 72 y.o. male who presents today for same-day appointment with a chief complaint of cough.   HPI:  Cough, Acute problem Started about a week ago. Getting worse over that time. Associated with rhinorrhea, drainage, congestion and some shortness of breath. No fevers or chills. Tried ibuprofen which helped a little bit. Some sick contacts at work. Symptoms are worse and night. No other obvious alleviating or aggravating factors.   ROS: Per HPI  PMH: He reports that he has never smoked. He has never used smokeless tobacco. He reports that he drinks about 7.0 standard drinks of alcohol per week. He reports that he does not use drugs.  Objective:  Physical Exam: BP 128/78 (BP Location: Left Arm, Patient Position: Sitting, Cuff Size: Normal)   Pulse 64   Temp 98.3 F (36.8 C) (Oral)   Ht 5' 6.5" (1.689 m)   Wt 167 lb 9.6 oz (76 kg)   SpO2 94%   BMI 26.65 kg/m   Gen: NAD, resting comfortably HEENT: TMs clear bilaterally. OP slightly erythematous without exudate. Nasal mucosa boggy and erythematous bilaterally with clear discharge.  CV: RRR with no murmurs appreciated Pulm: NWOB, CTAB with no crackles, wheezes, or rhonchi  Assessment/Plan:  Cough Likely secondary to viral URI. No signs of bacterial infection. Start atrovent for rhinorrhea/sinus congestion. Start tessalon for cough. Sent in a "pocket prescription" for azithromycin with strict instruction to not start unless symptoms worsen or fail to improve within the next several days. Recommended tylenol and/or motrin as needed for low grade fever and pain. Encouraged good oral hydration. Return precautions reviewed. Follow up as needed.   Algis Greenhouse. Jerline Pain, MD 02/01/2018 11:19 AM

## 2018-02-16 ENCOUNTER — Ambulatory Visit (INDEPENDENT_AMBULATORY_CARE_PROVIDER_SITE_OTHER): Payer: 59

## 2018-02-16 DIAGNOSIS — Z23 Encounter for immunization: Secondary | ICD-10-CM

## 2018-02-22 ENCOUNTER — Ambulatory Visit: Payer: 59

## 2018-03-01 ENCOUNTER — Ambulatory Visit: Payer: 59

## 2018-03-17 ENCOUNTER — Ambulatory Visit (INDEPENDENT_AMBULATORY_CARE_PROVIDER_SITE_OTHER): Payer: 59

## 2018-03-17 DIAGNOSIS — Z23 Encounter for immunization: Secondary | ICD-10-CM

## 2019-01-13 ENCOUNTER — Other Ambulatory Visit: Payer: Self-pay | Admitting: Family Medicine

## 2019-01-15 NOTE — Telephone Encounter (Signed)
Last visit 12/16/2017 (CPE/med check) Please call pt to schedule follow-up or CPE.

## 2019-01-16 NOTE — Telephone Encounter (Signed)
Patient has been scheduled for 01/30/19 at 940am

## 2019-01-29 NOTE — Patient Instructions (Addendum)
Health Maintenance Due  Topic Date Due  . TETANUS/TDAP - schedule nurse visit 1-2 months 10/08/2018  . INFLUENZA VACCINE - high dose today 12/02/2018   No changes today other than trying flonase for 2 weeks and letting us know if symptoms worsen or fail to improve   Please stop by lab before you go If you do not have mychart- we will call you about results within 5 business days of [place] receiving them.  If you have mychart- we will send your results within 3 business days of [place] receiving them.  If abnormal or we want to clarify a result, we will call or mychart you to make sure you receive the message.  If you have questions or concerns or don't hear within 5-7 days, please send [place] a message or call us.

## 2019-01-30 ENCOUNTER — Encounter: Payer: Self-pay | Admitting: Family Medicine

## 2019-01-30 ENCOUNTER — Other Ambulatory Visit: Payer: Self-pay

## 2019-01-30 ENCOUNTER — Ambulatory Visit (INDEPENDENT_AMBULATORY_CARE_PROVIDER_SITE_OTHER): Payer: 59 | Admitting: Family Medicine

## 2019-01-30 VITALS — BP 124/74 | HR 63 | Temp 98.0°F | Ht 67.0 in | Wt 163.2 lb

## 2019-01-30 DIAGNOSIS — Z125 Encounter for screening for malignant neoplasm of prostate: Secondary | ICD-10-CM

## 2019-01-30 DIAGNOSIS — Z0000 Encounter for general adult medical examination without abnormal findings: Secondary | ICD-10-CM | POA: Diagnosis not present

## 2019-01-30 DIAGNOSIS — M545 Low back pain, unspecified: Secondary | ICD-10-CM

## 2019-01-30 DIAGNOSIS — Z23 Encounter for immunization: Secondary | ICD-10-CM | POA: Diagnosis not present

## 2019-01-30 DIAGNOSIS — G8929 Other chronic pain: Secondary | ICD-10-CM

## 2019-01-30 DIAGNOSIS — E785 Hyperlipidemia, unspecified: Secondary | ICD-10-CM

## 2019-01-30 LAB — CBC
HCT: 45 % (ref 39.0–52.0)
Hemoglobin: 15.4 g/dL (ref 13.0–17.0)
MCHC: 34.2 g/dL (ref 30.0–36.0)
MCV: 93.1 fl (ref 78.0–100.0)
Platelets: 220 10*3/uL (ref 150.0–400.0)
RBC: 4.84 Mil/uL (ref 4.22–5.81)
RDW: 13.4 % (ref 11.5–15.5)
WBC: 7.2 10*3/uL (ref 4.0–10.5)

## 2019-01-30 LAB — LIPID PANEL
Cholesterol: 184 mg/dL (ref 0–200)
HDL: 58.7 mg/dL (ref >39.00)
LDL Cholesterol: 102 mg/dL — ABNORMAL HIGH (ref 0–99)
NonHDL: 125.14
Total CHOL/HDL Ratio: 3
Triglycerides: 114 mg/dL (ref 0.0–149.0)
VLDL: 22.8 mg/dL (ref 0.0–40.0)

## 2019-01-30 LAB — COMPREHENSIVE METABOLIC PANEL
ALT: 19 U/L (ref 0–53)
AST: 18 U/L (ref 0–37)
Albumin: 4.5 g/dL (ref 3.5–5.2)
Alkaline Phosphatase: 45 U/L (ref 39–117)
BUN: 12 mg/dL (ref 6–23)
CO2: 29 mEq/L (ref 19–32)
Calcium: 9.8 mg/dL (ref 8.4–10.5)
Chloride: 103 mEq/L (ref 96–112)
Creatinine, Ser: 0.95 mg/dL (ref 0.40–1.50)
GFR: 78.03 mL/min (ref >60.00)
Glucose, Bld: 89 mg/dL (ref 70–99)
Potassium: 4.9 mEq/L (ref 3.5–5.1)
Sodium: 140 mEq/L (ref 135–145)
Total Bilirubin: 0.9 mg/dL (ref 0.2–1.2)
Total Protein: 7 g/dL (ref 6.0–8.3)

## 2019-01-30 LAB — PSA: PSA: 1.26 ng/mL (ref 0.10–4.00)

## 2019-01-30 MED ORDER — ATORVASTATIN CALCIUM 10 MG PO TABS: 10.0000 mg | ORAL_TABLET | Freq: Every day | ORAL | 3 refills | Status: DC

## 2019-01-30 NOTE — Progress Notes (Signed)
Phone: [phone]   Subjective:  Patient presents today for their annual physical. Chief complaint-noted.   See problem oriented charting- ROS- full  review of systems was completed and negative  except for: low back pain  The following were reviewed and entered/updated in epic: Past Medical History:  Diagnosis Date  . Arthritis   . Detached retina    L 04/2014.   . Environmental allergies   . HYPERLIPIDEMIA 02/06/2007   Patient Active Problem List   Diagnosis Date Noted  . Hyperlipidemia 02/06/2007    Priority: Medium  . Palpitations 11/22/2013   Past Surgical History:  Procedure Laterality Date  . cataract surgery     01/09/15 left. right 2018.   . INGUINAL HERNIA REPAIR     bilateral  . KNEE ARTHROSCOPY  2007   left  . VITRECTOMY     06/2014 left  . VITRECTOMY     10/2017    Family History  Problem Relation Age of Onset  . Melanoma Brother   . Heart disease Brother 50       mi, stent  . Heart disease Brother 32       MI-required stent  . Heart failure Mother        CHF and heart disease; had breast cancer in 5s  . Leukemia Father   . Colon cancer Neg Hx   . Rectal cancer Neg Hx   . Stomach cancer Neg Hx     Medications- reviewed and updated Current Outpatient Medications  Medication Sig Dispense Refill  . atorvastatin (LIPITOR) 10 MG tablet Take 1 tablet (10 mg total) by mouth daily. 90 tablet 3  . Calcium Carbonate-Vit D-Min (CALTRATE 600+D PLUS) 600-400 MG-UNIT per tablet Chew 1 tablet by mouth daily.      Marland Kitchen loratadine (CLARITIN) 10 MG tablet Take 10 mg by mouth daily.    . Multiple Vitamins-Minerals (CENTRUM SILVER PO) Take by mouth daily.      . Omega-3 Fatty Acids (SALMON OIL-1000) 200 MG CAPS Take by mouth daily.       No current facility-administered medications for this visit.     Allergies-reviewed and updated Allergies  Allergen Reactions  . Caffeine     REACTION: tinnitis  . Sulfites     Per patient he has difficulty sleeping  .  Adhesive [Tape] Rash    Social History   Social History Narrative   Married and has a step son.       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       Enjoys family time   Objective  Objective:  BP 124/74 (BP Location: Left Arm, Patient Position: Sitting, Cuff Size: Normal)   Pulse 63   Temp 98 F (36.7 C)   Ht 5\' 7"  (1.702 m)   Wt 163 lb 3.2 oz (74 kg)   SpO2 96%   BMI 25.56 kg/m  Gen: NAD, resting comfortably HEENT: Mucous membranes are moist. Oropharynx normal. Deviated septum to right- mild erythema in that nare without discharge Neck: no thyromegaly or cervical lymphadenoptahy CV: RRR no murmurs rubs or gallops Lungs: CTAB no crackles, wheeze, rhonchi Abdomen: soft/nontender/nondistended/normal bowel sounds. No rebound or guarding.  Ext: no edema Skin: warm, dry Neuro: grossly normal, moves all extremities, PERRLA   Assessment and Plan  73 y.o. male presenting for annual physical.  Health Maintenance counseling: 1. Anticipatory guidance: Patient  counseled regarding regular dental exams -q6 months, eye exams - yearly with each specialist Dr. Ellie Lunch and retinal specialist,  avoiding smoking and second hand smoke , limiting alcohol to 2 beverages per day- usually 1 or less.   2. Risk factor reduction:  Advised patient of need for regular exercise and diet rich and fruits and vegetables to reduce risk of heart attack and stroke. Exercise- very little lately other than lawn once a week- but states strenuous. Diet-diet slipping some- having to use prepared foods so more salt.  Wt Readings from Last 3 Encounters:  01/30/19 163 lb 3.2 oz (74 kg)  02/01/18 167 lb 9.6 oz (76 kg)  12/26/17 169 lb 3.2 oz (76.7 kg)  3. Immunizations/screenings/ancillary studies- did flu shot today. Tdap- will come back for this.  Immunization History  Administered Date(s) Administered  . Fluad  Quad(high Dose 65+) 01/30/2019  . H1N1 04/16/2008  . Influenza Whole 02/01/2008, 02/04/2009, 02/01/2011  . Influenza, High Dose Seasonal PF 01/26/2016, 01/17/2017, 02/16/2018  . Influenza,inj,Quad PF,6+ Mos 01/17/2015  . Influenza-Unspecified 02/14/2014  . Pneumococcal Conjugate-13 01/17/2015  . Pneumococcal Polysaccharide-23 09/18/2012  . Td 10/07/2008  . Zoster 06/11/2011  . Zoster Recombinat (Shingrix) 12/23/2017, 03/17/2018  4. Prostate cancer screening-  Will get 1 final PSA- if stable will stop in future- mild increase in last few years so want to make sure not further increasing.  Defer rectal- stable nocturia once a night Lab Results  Component Value Date   PSA 1.62 12/19/2017   PSA 1.27 12/14/2016   PSA 0.94 01/14/2015   5. Colon cancer screening - 02/2011 with 10 year follow up 6. Skin cancer screening- no dermatologist. advised regular sunscreen use. Denies worrisome, changing, or new skin lesions.  7. never smoker  Status of chronic or acute concerns   Lower back pain- going on for months. Does a fair amount of heavy lifting trying to help with MS and currently in flare. Has seen Dr. French Ana in past but not for back. Pain in low back. No sciatica. No midline pain. Pain is throughout low back. Pain level is 5-8/10. Worse after sitting and then trying to move- feels rather stiff. Has thought about taking advil but has not taken yet.  - referral back to Dr. French Ana- patient's orthopedist - could trial advil twice a day for 5 days if kidneys ok.   GERD- intermittent issues with this- takes pepcid at bedtime. Advised before dinner pepcid if trials nsaids.   Hyperlipidemia- takes atorvastatin before bed- he may try dinner time as feels like bothers his relux. Also takes salmon oil   Allergies- takes claritin before bed. Does have some mild irritation right nostril- can try flonase/fluticasone as long as no bleeding. Slight mucus for a few weeks, some sinus pain. He will call if  not improving- we can trial augmentin for 7 days.   Recommended follow up: 1 year physical   Lab/Order associations: fasting   ICD-10-CM   1. Preventative health care  Z00.00   2. Hyperlipidemia, unspecified hyperlipidemia type  E78.5   3. Chronic bilateral low back pain without sciatica  M54.5 Ambulatory referral to Orthopedics   G89.29   4. Need for immunization against influenza  Z23 Flu Vaccine QUAD High Dose(Fluad)    Meds ordered this encounter  Medications  . atorvastatin (LIPITOR) 10 MG tablet    Sig: Take 1 tablet (10 mg total) by mouth daily.    Dispense:  90 tablet    Refill:  3  Return precautions advised.  Garret Reddish, MD

## 2019-01-30 NOTE — Addendum Note (Signed)
Addended by: Marin Olp on: 01/30/2019 10:37 AM   Modules accepted: Orders

## 2019-05-04 DIAGNOSIS — I2510 Atherosclerotic heart disease of native coronary artery without angina pectoris: Secondary | ICD-10-CM

## 2019-05-04 HISTORY — DX: Atherosclerotic heart disease of native coronary artery without angina pectoris: I25.10

## 2019-06-29 ENCOUNTER — Ambulatory Visit: Payer: 59 | Attending: Internal Medicine

## 2019-06-29 DIAGNOSIS — Z23 Encounter for immunization: Secondary | ICD-10-CM

## 2019-06-29 NOTE — Progress Notes (Signed)
   Covid-19 Vaccination Clinic  Name:  Brett Ross    MRN: [id number] DOB: 10/19/1945  06/29/2019  [name] was observed post Covid-19 immunization for 15 minutes without incidence. He was provided with Vaccine Information Sheet and instruction to access the V-Safe system.   [name] was instructed to call 911 with any severe reactions post vaccine: Marland Kitchen Difficulty breathing  . Swelling of your face and throat  . A fast heartbeat  . A bad rash all over your body  . Dizziness and weakness    Immunizations Administered    Name Date Dose VIS Date Route   Pfizer COVID-19 Vaccine 06/29/2019 12:27 PM 0.3 mL 04/13/2019 Intramuscular   Manufacturer: [place]   Lot: [id number]   [place]: [id number]

## 2019-07-24 ENCOUNTER — Ambulatory Visit: Payer: 59 | Attending: Internal Medicine

## 2019-07-24 DIAGNOSIS — Z23 Encounter for immunization: Secondary | ICD-10-CM

## 2019-07-24 NOTE — Progress Notes (Signed)
   Covid-19 Vaccination Clinic  Name:  Brett Ross    MRN: [id number] DOB: 28-Sep-1945  07/24/2019  [name] was observed post Covid-19 immunization for 15 minutes without incident. He was provided with Vaccine Information Sheet and instruction to access the V-Safe system.   [name] was instructed to call 911 with any severe reactions post vaccine: Marland Kitchen Difficulty breathing  . Swelling of face and throat  . A fast heartbeat  . A bad rash all over body  . Dizziness and weakness   Immunizations Administered    Name Date Dose VIS Date Route   Pfizer COVID-19 Vaccine 07/24/2019  3:13 PM 0.3 mL 04/13/2019 Intramuscular   Manufacturer: [place]   Lot: [id number]   [place]: [id number]

## 2019-07-31 ENCOUNTER — Ambulatory Visit: Payer: 59

## 2019-10-09 ENCOUNTER — Other Ambulatory Visit: Payer: Self-pay

## 2019-10-09 ENCOUNTER — Encounter (INDEPENDENT_AMBULATORY_CARE_PROVIDER_SITE_OTHER): Payer: Self-pay | Admitting: Ophthalmology

## 2019-10-09 ENCOUNTER — Ambulatory Visit (INDEPENDENT_AMBULATORY_CARE_PROVIDER_SITE_OTHER): Payer: 59 | Admitting: Ophthalmology

## 2019-10-09 DIAGNOSIS — H35343 Macular cyst, hole, or pseudohole, bilateral: Secondary | ICD-10-CM | POA: Diagnosis not present

## 2019-10-09 DIAGNOSIS — Z9889 Other specified postprocedural states: Secondary | ICD-10-CM | POA: Insufficient documentation

## 2019-10-09 DIAGNOSIS — Z8669 Personal history of other diseases of the nervous system and sense organs: Secondary | ICD-10-CM | POA: Diagnosis not present

## 2019-10-09 NOTE — Progress Notes (Signed)
10/09/2019     CHIEF COMPLAINT Patient presents for Retina Follow Up   HISTORY OF PRESENT ILLNESS: Brett Ross is a 74 y.o. male who presents to the clinic today for:   HPI    Retina Follow Up    Patient presents with  Other.  In both eyes.  Duration of 1 year.  Since onset it is stable.          Comments    1 year follow up - OCT OU Patient denies change in vision and overall has no complaints.        Last edited by Gerda Diss on 10/09/2019  3:05 PM. (History)      Referring physician: Marin Olp, [place],  [place] [address]  HISTORICAL INFORMATION:   Selected notes from the MEDICAL RECORD NUMBER       CURRENT MEDICATIONS: No current outpatient medications on file. (Ophthalmic Drugs)   No current facility-administered medications for this visit. (Ophthalmic Drugs)   Current Outpatient Medications (Other)  Medication Sig  . atorvastatin (LIPITOR) 10 MG tablet Take 1 tablet (10 mg total) by mouth daily.  . Calcium Carbonate-Vit D-Min (CALTRATE 600+D PLUS) 600-400 MG-UNIT per tablet Chew 1 tablet by mouth daily.    Marland Kitchen loratadine (CLARITIN) 10 MG tablet Take 10 mg by mouth daily.  . Multiple Vitamins-Minerals (CENTRUM SILVER PO) Take by mouth daily.    . Omega-3 Fatty Acids (SALMON OIL-1000) 200 MG CAPS Take by mouth daily.     No current facility-administered medications for this visit. (Other)      REVIEW OF SYSTEMS:    ALLERGIES Allergies  Allergen Reactions  . Caffeine     REACTION: tinnitis  . Sulfites     Per patient he has difficulty sleeping  . Adhesive [Tape] Rash    PAST MEDICAL HISTORY Past Medical History:  Diagnosis Date  . Arthritis   . Detached retina    L 04/2014.   . Environmental allergies   . HYPERLIPIDEMIA 02/06/2007   Past Surgical History:  Procedure Laterality Date  . cataract surgery     01/09/15 left. right 2018.   . INGUINAL HERNIA REPAIR     bilateral  . KNEE ARTHROSCOPY  2007   left  . VITRECTOMY     06/2014 left  . VITRECTOMY     10/2017    FAMILY HISTORY Family History  Problem Relation Age of Onset  . Melanoma Brother   . Heart disease Brother 64       mi, stent  . Heart disease Brother 76       MI-required stent  . Heart failure Mother        CHF and heart disease; had breast cancer in 33s  . Leukemia Father   . Colon cancer Neg Hx   . Rectal cancer Neg Hx   . Stomach cancer Neg Hx     SOCIAL HISTORY Social History   Tobacco Use  . Smoking status: Never Smoker  . Smokeless tobacco: Never Used  Substance Use Topics  . Alcohol use: Yes    Alcohol/week: 7.0 standard drinks    Types: 7 Cans of beer per week  . Drug use: No         OPHTHALMIC EXAM:  Base Eye Exam    Visual Acuity (Snellen - Linear)      Right Left   Dist cc 20/20 20/20   Correction: Glasses       Tonometry (  Tonopen, 3:10 PM)      Right Left   Pressure 16 15       Pupils      Pupils Dark Light Shape React APD   Right PERRL 4 3 Round Sluggish None   Left PERRL 4 3 Round Sluggish None       Visual Fields (Counting fingers)      Left Right    Full Full       Extraocular Movement      Right Left    Full Full       Neuro/Psych    Oriented x3: Yes   Mood/Affect: Normal       Dilation    Both eyes: 1.0% Mydriacyl, 2.5% Phenylephrine @ 3:10 PM        Slit Lamp and Fundus Exam    External Exam      Right Left   External Normal Normal       Slit Lamp Exam      Right Left   Lids/Lashes Normal Normal   Conjunctiva/Sclera White and quiet White and quiet   Cornea Clear Clear   Anterior Chamber Deep and quiet Deep and quiet   Iris Round and reactive Round and reactive   Lens Posterior chamber intraocular lens Posterior chamber intraocular lens   Anterior Vitreous Normal Normal       Fundus Exam      Right Left   Posterior Vitreous Vitrectomized Vitrectomized   Disc Normal Normal   C/D Ratio 0.25 0.25   Macula Normal Normal   Vessels Normal  Normal   Periphery Normal Normal          IMAGING AND PROCEDURES  Imaging and Procedures for 10/09/19  OCT, Retina - OU - Both Eyes       Right Eye Quality was good. Scan locations included subfoveal. Central Foveal Thickness: 346.   Left Eye Quality was good. Scan locations included subfoveal. Central Foveal Thickness: 315.   Notes History of macular hole and repair OU, no active disease in either eye.                ASSESSMENT/PLAN:  No problem-specific Assessment & Plan notes found for this encounter.      ICD-10-CM   1. 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 History of vitrectomy  Z98.890 OCT, Retina - OU - Both Eyes  3. History of retinal detachment  Z86.69     1.  History of bilateral macular holes, status post repair.  2.  History of retinal detachment the past.  3.  No active ocular conditions at this time.  Ophthalmic Meds Ordered this visit:  No orders of the defined types were placed in this encounter.      Return in about 1 year (around 10/08/2020) for DILATE OU, OCT.  There are no Patient Instructions on file for this visit.   Explained the diagnoses, plan, and follow up with the patient and they expressed understanding.  Patient expressed understanding of the importance of proper follow up care.   Clent Demark Colan Laymon M.D. Diseases & Surgery of the Retina and Vitreous [facility]ina [facility] Diabetic [place] 10/09/19     Abbreviations: M myopia (nearsighted); A astigmatism; H hyperopia (farsighted); P presbyopia; Mrx spectacle prescription;  CTL contact lenses; OD right eye; OS left eye; OU both eyes  XT exotropia; ET esotropia; PEK punctate epithelial keratitis; PEE punctate epithelial erosions; DES dry eye syndrome;  MGD meibomian gland dysfunction; ATs artificial tears; PFAT's preservative free artificial tears; [place]kon nuclear sclerotic cataract; PSC posterior subcapsular cataract; ERM epi-retinal membrane; PVD  posterior vitreous detachment; RD retinal detachment; DM diabetes mellitus; DR diabetic retinopathy; NPDR non-proliferative diabetic retinopathy; PDR proliferative diabetic retinopathy; CSME clinically significant macular edema; DME diabetic macular edema; dbh dot blot hemorrhages; CWS cotton wool spot; POAG primary open angle glaucoma; C/D cup-to-disc ratio; HVF humphrey visual field; GVF goldmann visual field; OCT optical coherence tomography; IOP intraocular pressure; BRVO Branch retinal vein occlusion; CRVO central retinal vein occlusion; CRAO central retinal artery occlusion; BRAO branch retinal artery occlusion; RT retinal tear; SB scleral buckle; PPV pars plana vitrectomy; VH Vitreous hemorrhage; PRP panretinal laser photocoagulation; IVK intravitreal kenalog; VMT vitreomacular traction; MH Macular hole;  NVD neovascularization of the disc; NVE neovascularization elsewhere; AREDS age related eye disease study; ARMD age related macular degeneration; POAG primary open angle glaucoma; EBMD epithelial/anterior basement membrane dystrophy; ACIOL anterior chamber intraocular lens; IOL intraocular lens; PCIOL posterior chamber intraocular lens; Phaco/IOL phacoemulsification with intraocular lens placement; [place]ham photorefractive keratectomy; LASIK laser assisted in situ keratomileusis; HTN hypertension; DM diabetes mellitus; COPD chronic obstructive pulmonary disease

## 2019-11-24 ENCOUNTER — Other Ambulatory Visit: Payer: Self-pay | Admitting: Family Medicine

## 2019-12-19 ENCOUNTER — Telehealth: Payer: Self-pay | Admitting: Family Medicine

## 2019-12-19 NOTE — Telephone Encounter (Signed)
Nurse Assessment Nurse: Delphina Cahill, RN, Santiago Glad Date/Time Brett Ross Time): 12/18/2019 4:24:48 PM Confirm and document reason for call. If symptomatic, describe symptoms. ---Caller states he is having chest pain and dyspnea. Got really hot on Friday pushing wife in wheelchair up a hill. Had a cramp is diaphragm afterwards and ever since. Has the patient had close contact with a person known or suspected to have the novel coronavirus illness OR traveled / lives in area with major community spread (including international travel) in the last 14 days from the onset of symptoms? * If Asymptomatic, screen for exposure and travel within the last 14 days. ---No Does the patient have any new or worsening symptoms? ---Yes Will a triage be completed? ---Yes Related visit to physician within the last 2 weeks? ---No Does the PT have any chronic conditions? (i.e. diabetes, asthma, this includes High risk factors for pregnancy, etc.) ---No Is this a behavioral health or substance abuse call? ---No Guidelines Guideline Title Affirmed Question Affirmed Notes Nurse Date/Time (Eastern Time) Chest Pain Pain also in shoulder(s) or arm(s) or jaw (Exception: pain is clearly made worse by movement) Delphina Cahill, RN, Santiago Glad 12/18/2019 4:30:33 PM Disp. Time Brett Ross Time) Disposition Final User 12/18/2019 4:24:01 PM Send to Urgent Marina Goodell, DiedrePLEASE NOTE: All timestamps contained within this report are represented as [place] Standard Time. CONFIDENTIALTY NOTICE: This fax transmission is intended only for the addressee. It contains information that is legally privileged, confidential or otherwise protected from use or disclosure. If you are not the intended recipient, you are strictly prohibited from reviewing, disclosing, copying using or disseminating any of this information or taking any action in reliance on or regarding this information. If you have received this fax in error, please notify us immediately by  telephone so that we can arrange for its return to [place]. Phone: [phone], Toll-Free: [phone], Fax: [phone] Page: 2 of 2 Call Id: [id number] 12/18/2019 4:34:50 PM Go to ED Now Yes Delphina Cahill, RN, [place] Pellant Disagree/Comply Comply Caller Understands Yes PreDisposition [place] Advice Given Per Guideline GO TO ED NOW: * You need to be seen in the Emergency Department. * Go to the ED at ___________ [place] now. Drive carefully. * Another adult should drive. CALL EMS IF: * Severe difficulty breathing occurs * Passes out or becomes too weak to stand * You become worse. CARE ADVICE given per Chest Pain (Adult) guideline. Referrals GO TO FACILITY UNDECIDE

## 2019-12-19 NOTE — Telephone Encounter (Signed)
ED is reasonable. If he does not go- needs a follow up at least with one of my colleagues here or at another [employer] as im out of office rest of week

## 2019-12-19 NOTE — Telephone Encounter (Signed)
Called patient did not go to ed has not had any symptoms but has not done any activity.

## 2019-12-20 NOTE — Telephone Encounter (Signed)
Please call for app  

## 2019-12-21 ENCOUNTER — Ambulatory Visit (INDEPENDENT_AMBULATORY_CARE_PROVIDER_SITE_OTHER): Payer: 59 | Admitting: Family Medicine

## 2019-12-21 ENCOUNTER — Encounter: Payer: Self-pay | Admitting: Family Medicine

## 2019-12-21 ENCOUNTER — Other Ambulatory Visit: Payer: Self-pay

## 2019-12-21 VITALS — BP 138/84 | HR 65 | Temp 98.0°F | Ht 67.0 in | Wt 163.6 lb

## 2019-12-21 DIAGNOSIS — Z8249 Family history of ischemic heart disease and other diseases of the circulatory system: Secondary | ICD-10-CM | POA: Diagnosis not present

## 2019-12-21 DIAGNOSIS — R079 Chest pain, unspecified: Secondary | ICD-10-CM

## 2019-12-21 NOTE — Patient Instructions (Addendum)
-  start baby aspirin 81mg , no heavy exertion and labs today -stat referral in for Dr. Johnsie Cancel.  -if you have pain again I want you to go to ER!   Talk to you soon and nice to meet you!  Dr. Rogers Blocker

## 2019-12-21 NOTE — Telephone Encounter (Signed)
Patient has been scheduled

## 2019-12-21 NOTE — Progress Notes (Signed)
ekgPatient: Brett Ross MRN: [id number] DOB: 1945-05-26 PCP: Marin Olp, MD     Subjective:  Chief Complaint  Patient presents with  . Chest Pain    HPI: The patient is a 74 y.o. male who presents today for chest pain last Friday. He says that he came in from the heat and pushing your wife up the driveway. He states it is one heck of a push with the wheelchair. When he got inside he was sweaty profusley and had a pain in his diaphragm and could hardly take a breath. He layed down. He states since then he hasn't felt totally well and was wiped out over the weekend. He has still not felt great this week with some cramping sensation intermittently in his chest.  This cramping sensation will last 5-10 minutes. He is not exerting himself when this happens. He has no pain going up and down stairs. Does feel winded. When he thinks he was short of breath. He states pain can go to the back as well.  He say that he hasn't felt good since then.  He did not take anything for the pain.  He has strong FH of CAD/MI. Both of his brothers have had Mis. His oldest brother was a smoker and his next brother was 58-73 with his first heart attack. His mother died of heart disease and had carotid arthrosclerosis. Dad died of leukemia-age 46 years. He has no history of HTN or diabetes.  He is very stressed at home. Full caregiver of his wife at home who has advanced MS and is in [facility]. He never gets a break from this. He had a normal stress test in 2015.   He states over the last 6-8 months he will wake up and one of his arms would be tingling despite him now laying on it. He has tried to get in to see Dr. Johnsie Cancel but he needs a referral.   Review of Systems  Constitutional: Positive for fatigue. Negative for chills and fever.  Respiratory: Negative for chest tightness and shortness of breath.   Cardiovascular: Negative for chest pain, palpitations and leg swelling.  Gastrointestinal: Negative for  abdominal pain.  Neurological: Negative for dizziness and headaches.    Allergies Patient is allergic to caffeine, sulfites, and adhesive [tape].  Past Medical History Patient  has a past medical history of Arthritis, Detached retina, Environmental allergies, and HYPERLIPIDEMIA (02/06/2007).  Surgical History Patient  has a past surgical history that includes Knee arthroscopy (2007); cataract surgery; Vitrectomy; Inguinal hernia repair; and Vitrectomy.  Family History Pateint's family history includes Heart disease (age of onset: 61) in his brother; Heart disease (age of onset: 29) in his brother; Heart failure in his mother; Leukemia in his father; Melanoma in his brother.  Social History Patient  reports that he has never smoked. He has never used smokeless tobacco. He reports current alcohol use of about 7.0 standard drinks of alcohol per week. He reports that he does not use drugs.    Objective: Vitals:   12/21/19 1316  BP: 138/84  Pulse: 65  Temp: 98 F (36.7 C)  TempSrc: Temporal  SpO2: 98%  Weight: 163 lb 9.6 oz (74.2 kg)  Height: 5\' 7"  (1.702 m)    Body mass index is 25.62 kg/m.  Physical Exam Vitals reviewed.  Constitutional:      Appearance: He is well-developed.  HENT:     Head: Normocephalic and atraumatic.  Cardiovascular:     Rate and Rhythm: Normal  rate and regular rhythm.     Heart sounds: Normal heart sounds.  Pulmonary:     Effort: Pulmonary effort is normal.     Breath sounds: Normal breath sounds.  Chest:     Chest wall: No tenderness.  Abdominal:     General: Bowel sounds are normal.     Palpations: Abdomen is soft.  Musculoskeletal:     Cervical back: Normal range of motion and neck supple.  Skin:    General: Skin is warm.     Capillary Refill: Capillary refill takes less than 2 seconds.  Neurological:     General: No focal deficit present.     Mental Status: He is alert.  Psychiatric:        Mood and Affect: Mood normal.         Behavior: Behavior normal.       ekg: NSR with rate of 63. No st changes/abnormalities   Assessment/plan: 1. Chest pain, unspecified type Extremely strong family history with atypical chest pain. Checking labs today. EKG with no changes.  Referral placed to cardiology and he will see them in 3 days. Will hold off on stress test and let them order. Starting baby aspirin and very strict precautions given for any substernal chest pain to go to ER.  - EKG 12-Lead - CBC with Differential/Platelet; Future - Comprehensive metabolic panel; Future - TSH; Future - Lipid panel; Future - Ambulatory referral to Cardiology - Lipid panel - TSH - Comprehensive metabolic panel - CBC with Differential/Platelet   This visit occurred during the SARS-CoV-2 public health emergency.  Safety protocols were in place, including screening questions prior to the visit, additional usage of staff PPE, and extensive cleaning of exam room while observing appropriate contact time as indicated for disinfecting solutions.      Return if symptoms worsen or fail to improve.    Orma Flaming, MD [place]   12/21/2019

## 2019-12-22 LAB — CBC WITH DIFFERENTIAL/PLATELET
Absolute Monocytes: 670 cells/uL (ref 200–950)
Basophils Absolute: 22 cells/uL (ref 0–200)
Basophils Relative: 0.3 %
Eosinophils Absolute: 101 cells/uL (ref 15–500)
Eosinophils Relative: 1.4 %
HCT: 44 % (ref 38.5–50.0)
Hemoglobin: 15.2 g/dL (ref 13.2–17.1)
Lymphs Abs: 2549 cells/uL (ref 850–3900)
MCH: 32.2 pg (ref 27.0–33.0)
MCHC: 34.5 g/dL (ref 32.0–36.0)
MCV: 93.2 fL (ref 80.0–100.0)
MPV: 9.9 fL (ref 7.5–12.5)
Monocytes Relative: 9.3 %
Neutro Abs: 3859 cells/uL (ref 1500–7800)
Neutrophils Relative %: 53.6 %
Platelets: 226 10*3/uL (ref 140–400)
RBC: 4.72 10*6/uL (ref 4.20–5.80)
RDW: 12.7 % (ref 11.0–15.0)
Total Lymphocyte: 35.4 %
WBC: 7.2 10*3/uL (ref 3.8–10.8)

## 2019-12-22 LAB — LIPID PANEL
Cholesterol: 153 mg/dL (ref <200)
HDL: 55 mg/dL (ref >=40)
LDL Cholesterol (Calc): 70 mg/dL (calc)
Non-HDL Cholesterol (Calc): 98 mg/dL (calc) (ref <130)
Total CHOL/HDL Ratio: 2.8 (calc) (ref <5.0)
Triglycerides: 228 mg/dL — ABNORMAL HIGH (ref <150)

## 2019-12-22 LAB — COMPREHENSIVE METABOLIC PANEL
AG Ratio: 2 (calc) (ref 1.0–2.5)
ALT: 16 U/L (ref 9–46)
AST: 17 U/L (ref 10–35)
Albumin: 4.3 g/dL (ref 3.6–5.1)
Alkaline phosphatase (APISO): 44 U/L (ref 35–144)
BUN: 15 mg/dL (ref 7–25)
CO2: 29 mmol/L (ref 20–32)
Calcium: 9.2 mg/dL (ref 8.6–10.3)
Chloride: 104 mmol/L (ref 98–110)
Creat: 0.96 mg/dL (ref 0.70–1.18)
Globulin: 2.1 g/dL (calc) (ref 1.9–3.7)
Glucose, Bld: 85 mg/dL (ref 65–99)
Potassium: 4.6 mmol/L (ref 3.5–5.3)
Sodium: 140 mmol/L (ref 135–146)
Total Bilirubin: 0.7 mg/dL (ref 0.2–1.2)
Total Protein: 6.4 g/dL (ref 6.1–8.1)

## 2019-12-22 LAB — TSH: TSH: 1.15 mIU/L (ref 0.40–4.50)

## 2019-12-24 ENCOUNTER — Encounter: Payer: Self-pay | Admitting: Cardiovascular Disease

## 2019-12-24 ENCOUNTER — Ambulatory Visit (INDEPENDENT_AMBULATORY_CARE_PROVIDER_SITE_OTHER): Payer: 59 | Admitting: Cardiovascular Disease

## 2019-12-24 ENCOUNTER — Other Ambulatory Visit: Payer: Self-pay

## 2019-12-24 DIAGNOSIS — R072 Precordial pain: Secondary | ICD-10-CM

## 2019-12-24 DIAGNOSIS — I200 Unstable angina: Secondary | ICD-10-CM | POA: Insufficient documentation

## 2019-12-24 MED ORDER — ASPIRIN EC 81 MG PO TBEC: 81.0000 mg | DELAYED_RELEASE_TABLET | Freq: Every day | ORAL | 3 refills | Status: AC

## 2019-12-24 MED ORDER — NITROGLYCERIN 0.4 MG SL SUBL: 0.4000 mg | SUBLINGUAL_TABLET | SUBLINGUAL | 3 refills | Status: DC | PRN

## 2019-12-24 MED ORDER — METOPROLOL TARTRATE 50 MG PO TABS: ORAL_TABLET | ORAL | 0 refills | Status: DC

## 2019-12-24 NOTE — Patient Instructions (Addendum)
Medication Instructions:  1) START Apirin 41m once daily 2)  A prescription has been sent in for Nitroglycerin.  If you have chest pain that doesn't relieve quickly, place one tablet under your tongue and allow it to dissolve.  If no relief after 5 minutes, you may take another pill.  If no relief after 5 minutes, you may take a 3rd dose but you need to call 911 and report to ER immediately.  *If you need a refill on your cardiac medications before your next appointment, please call your pharmacy*   Lab Work: None  If you have labs (blood work) drawn today and your tests are completely normal, you will receive your results only by: .Marland KitchenMyChart Message (if you have MyChart) OR . A paper copy in the mail If you have any lab test that is abnormal or we need to change your treatment, we will call you to review the results.   Testing/Procedures: Your physician recommends that you have a Coronary CT performed.   Follow-Up: [facility] and your [facility] are our [facility].  As part of our continuing mission to provide you with exceptional heart care, we have created designated Provider Care Teams.  These Care Teams include your primary Cardiologist (physician) and Advanced Practice Providers (APPs -  Physician Assistants and Nurse Practitioners) who all work together to provide you with the care you need, when you need it.  We recommend signing up for the patient portal called "MyChart".  Sign up information is provided on this After Visit Summary.  MyChart is used to connect with patients for Virtual Visits (Telemedicine).  Patients are able to view lab/test results, encounter notes, upcoming appointments, etc.  Non-urgent messages can be sent to your provider as well.   To learn more about what you can do with MyChart, go to [email or web address]    Your next appointment:   2-3 month(s)  The format for your next appointment:   In Person  Provider:   SRichardson Dopp PA-C or  VRobbie Lis PA-C   Other Instructions  Your cardiac CT will be scheduled at one of the below locations:   [facility] [id number]([phone] [place] [phone] [facility] please [facility] minutes prior to test start time. Proceed to the [facility] (first floor) to check-in and test prep.  [facility] please arrive 15 mins early for check-in and test prep.  Please follow these instructions carefully (unless otherwise directed):  Hold all erectile dysfunction medications at least 3 days (72 hrs) prior to test.  On the Night Before the Test: . Be sure to Drink plenty of water. . Do not consume any caffeinated/decaffeinated beverages or chocolate 12 hours prior to your test. . Do not take any antihistamines 12 hours prior to your test.  On the Day of the Test: . Drink plenty of water. Do not drink any water within one hour of the test. . Do not eat any food 4 hours prior to the test. . You may take your regular medications prior to the test.  . Take metoprolol (Lopressor) two hours prior to test. . HOLD Furosemide/Hydrochlorothiazide morning of the test.       After the Test: . Drink plenty of  water. . After receiving IV contrast, you may experience a mild flushed feeling. This is normal. . On occasion, you may experience a mild rash up to 24 hours after the test. This is not dangerous. If this occurs, you can take Benadryl 25 mg and increase your fluid intake. . If you experience trouble breathing, this can be serious. If it is severe call 911 IMMEDIATELY. If it is mild, please call our office. . If you take any of these medications: Glipizide/Metformin, Avandament, Glucavance, please do  not take 48 hours after completing test unless otherwise instructed.   Once we have confirmed authorization from your insurance company, we will call you to set up a date and time for your test. Based on how quickly your insurance processes prior authorizations requests, please allow up to 4 weeks to be contacted for scheduling your Cardiac CT appointment. Be advised that routine Cardiac CT appointments could be scheduled as many as 8 weeks after your provider has ordered it.  For non-scheduling related questions, please contact the cardiac imaging nurse navigator should you have any questions/concerns: Marchia Bond, Cardiac Imaging Nurse Navigator Burley Saver, Interim Cardiac Imaging Nurse [place] [facility] Direct Office Dial: [phone]   For scheduling needs, including cancellations and rescheduling, please call Vivien Rota at [phone], option 3.

## 2019-12-24 NOTE — Progress Notes (Signed)
Cardiology Office Note:    Date:  12/24/2019   ID:  Brett Ross, DOB [birth year], MRN [id number]  PCP:  Marin Olp, MD  [facility] Cardiologist:  [place] Electrophysiologist:  None   Referring MD: Orma Flaming, MD   Chief Complaint  Patient presents with   Chest Pain    History of Present Illness:    Brett Ross is a 74 y.o. male with a hx of chest pain , hyperlipidemia.   We were asked to see him today by Orma Flaming, MD for further evaluation of his chest pain .   prevously saw Dr. Johnsie Cancel  For past several months, has been waking up with arm tingling .  Saw his primary MD   Several weeks ago , he was pushing his wife in wheelchair , 100 + degree day .   Developed chest pain when he was through. Lasted 20 min or so.  + diaphoresis,    Has not felt well since that Went to primary MD ECG on Aug. 20, 2021 showed NSR ,  No ST or T wave changes No regular exercise except for mowing his yard.    Brother ( smoker) had extensive CAD  2nd brother also has significant CAD  Mother had cad Father died of leukemia at age 15.   Labs from 8/20 were reviewed  - trigs are 228.    Past Medical History:  Diagnosis Date   Arthritis    Detached retina    L 04/2014.    Environmental allergies    HYPERLIPIDEMIA 02/06/2007    Past Surgical History:  Procedure Laterality Date   cataract surgery     01/09/15 left. right 2018.    INGUINAL HERNIA REPAIR     bilateral   KNEE ARTHROSCOPY  2007   left   VITRECTOMY     06/2014 left   VITRECTOMY     10/2017    Current Medications: Current Meds  Medication Sig   atorvastatin (LIPITOR) 10 MG tablet TAKE 1 TABLET BY MOUTH  DAILY   Calcium Carbonate-Vit D-Min (CALTRATE 600+D PLUS) 600-400 MG-UNIT per tablet Chew 1 tablet by mouth daily.     cetirizine (ZYRTEC) 10 MG tablet Take 10 mg by mouth daily.   loratadine (CLARITIN) 10 MG tablet Take 10 mg by mouth daily.     Multiple Vitamins-Minerals (CENTRUM SILVER PO) Take by mouth daily.     Omega-3 Fatty Acids (SALMON OIL-1000) 200 MG CAPS Take by mouth daily.       Allergies:   Caffeine, Sulfites, and Adhesive [tape]   Social History   Socioeconomic History   Marital status: Married    Spouse name: Not on file   Number of children: Not on file   Years of education: Not on file   Highest education level: Not on file  Occupational History   Not on file  Tobacco Use   Smoking status: Never Smoker   Smokeless tobacco: Never Used  Substance and Sexual Activity   Alcohol use: Yes    Alcohol/week: 7.0 standard drinks    Types: 7 Cans of beer per week   Drug use: No   Sexual activity: Not on file  Other Topics Concern   Not on file  Social History Narrative   Married and has a step son.       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       Enjoys family time   Social Determinants of Radio broadcast assistant Strain:    Difficulty of Paying Living Expenses: Not on file  Food Insecurity:    Worried About Charity fundraiser in the Last Year: Not on file   [employer] of Food in the Last Year: Not on file  Transportation Needs:    Lack of Transportation (Medical): Not on file   Lack of Transportation (Non-Medical): Not on file  Physical Activity:    Days of Exercise per Week: Not on file   Minutes of Exercise per Session: Not on file  Stress:    Feeling of Stress : Not on file  Social Connections:    Frequency of Communication with Friends and Family: Not on file   Frequency of Social Gatherings with Friends and Family: Not on file   Attends Religious Services: Not on file   Active Member of Clubs or Organizations: Not on file   Attends Archivist Meetings: Not on file   Marital Status: Not on file     Family History: The patient's family history  includes Heart disease (age of onset: 10) in his brother; Heart disease (age of onset: 68) in his brother; Heart failure in his mother; Leukemia in his father; Melanoma in his brother. There is no history of Colon cancer, Rectal cancer, or Stomach cancer.  ROS:   Please see the history of present illness.     All other systems reviewed and are negative.  EKGs/Labs/Other Studies Reviewed:    The following studies were reviewed today:   EKG:   12/21/19 :    NSR .  No ST or T wave changes.   Recent Labs: 12/21/2019: ALT 16; BUN 15; Creat 0.96; Hemoglobin 15.2; Platelets 226; Potassium 4.6; Sodium 140; TSH 1.15  Recent Lipid Panel    Component Value Date/Time   CHOL 153 12/21/2019 1356   TRIG 228 (H) 12/21/2019 1356   HDL 55 12/21/2019 1356   CHOLHDL 2.8 12/21/2019 1356   VLDL 22.8 01/30/2019 1036   LDLCALC 70 12/21/2019 1356    Physical Exam:    VS:  BP 114/66    Pulse 62    Ht 5' 6"  (1.676 m)    Wt 165 lb (74.8 kg)    SpO2 97%    BMI 26.63 kg/m     Wt Readings from Last 3 Encounters:  12/24/19 165 lb (74.8 kg)  12/21/19 163 lb 9.6 oz (74.2 kg)  01/30/19 163 lb 3.2 oz (74 kg)     GEN:  Well nourished, well developed in no acute distress HEENT: Normal NECK: No JVD; No carotid bruits LYMPHATICS: No lymphadenopathy CARDIAC: RRR, no murmurs, rubs, gallops RESPIRATORY:  Clear to auscultation without rales, wheezing or rhonchi  ABDOMEN: Soft, non-tender, non-distended MUSCULOSKELETAL:  No edema; No deformity  SKIN: Warm and dry NEUROLOGIC:  Alert and oriented x 3 PSYCHIATRIC:  Normal affect   ASSESSMENT:    1. Precordial pain    PLAN:    1.  Unstable angina: Brett Ross is having symptoms consistent with unstable angina. 1 severe episode of chest pain while pushing his wife who is in a wheelchair up the driveway.  He has had a few other milder episodes of chest pain while mowing the lawn.  He has not had any severe episodes at rest or with moderate exertion.  I think our next  best step is to proceed  with a coronary CT angiogram for further evaluation.  He does not have any EKG changes by EKG several days ago.  Will remind him to take aspirin 81 mg a day.  We will send in a prescription for nitroglycerin 0.4 mg sublingually as needed.  2.  Hyperlipidemia: Cholesterol levels look fairly well controlled.  His triglyceride level is elevated.  He admits to eating lots of preprepared foods.  I discussed improving his diet.   Medication Adjustments/Labs and Tests Ordered: Current medicines are reviewed at length with the patient today.  Concerns regarding medicines are outlined above.  Orders Placed This Encounter  Procedures   CT CORONARY MORPH W/CTA COR W/SCORE W/CA W/CM &/OR WO/CM   CT CORONARY FRACTIONAL FLOW RESERVE DATA PREP   CT CORONARY FRACTIONAL FLOW RESERVE FLUID ANALYSIS   Meds ordered this encounter  Medications   metoprolol tartrate (LOPRESSOR) 50 MG tablet    Sig: Take one tablet by mouth 2 hours prior to CT    Dispense:  1 tablet    Refill:  0   aspirin EC 81 MG tablet    Sig: Take 1 tablet (81 mg total) by mouth daily. Swallow whole.    Dispense:  90 tablet    Refill:  3   nitroGLYCERIN (NITROSTAT) 0.4 MG SL tablet    Sig: Place 1 tablet (0.4 mg total) under the tongue every 5 (five) minutes as needed for chest pain.    Dispense:  25 tablet    Refill:  3    Patient Instructions  Medication Instructions:  1) START Apirin 49m once daily 2)  A prescription has been sent in for Nitroglycerin.  If you have chest pain that doesn't relieve quickly, place one tablet under your tongue and allow it to dissolve.  If no relief after 5 minutes, you may take another pill.  If no relief after 5 minutes, you may take a 3rd dose but you need to call 911 and report to ER immediately.  *If you need a refill on your cardiac medications before your next appointment, please call your pharmacy*   Lab Work: None  If you have labs (blood work) drawn today  and your tests are completely normal, you will receive your results only by:  [place](if you have MyChart) OR  A paper copy in the mail If you have any lab test that is abnormal or we need to change your treatment, we will call you to review the results.   Testing/Procedures: Your physician recommends that you have a Coronary CT performed.   Follow-Up: At [facility] you and your health needs are our priority.  As part of our continuing mission to provide you with exceptional heart care, we have created designated Provider Care Teams.  These Care Teams include your primary Cardiologist (physician) and Advanced Practice Providers (APPs -  Physician Assistants and Nurse Practitioners) who all work together to provide you with the care you need, when you need it.  We recommend signing up for the patient portal called "MyChart".  Sign up information is provided on this After Visit Summary.  MyChart is used to connect with patients for Virtual Visits (Telemedicine).  Patients are able to view lab/test results, encounter notes, upcoming appointments, etc.  Non-urgent messages can be sent to your provider as well.   To learn more about what you can do with MyChart, go to [email or web address]    Your next appointment:   2-3 month(s)  The format for your next  appointment:   In Person  Provider:   Richardson Dopp, PA-C or Robbie Lis, PA-C   Other Instructions  Your cardiac CT will be scheduled at one of the below locations:   [facility] [address] [place], [place] [address] [phone]  [place] [address] [place], [place] [address] [phone]  If scheduled at [facility], please arrive at the [facility] main entrance of [facility] 30 minutes prior to test start time. Proceed to the [facility] Radiology Department (first floor) to check-in and test prep.  If scheduled at  [facility], please arrive 15 mins early for check-in and test prep.  Please follow these instructions carefully (unless otherwise directed):  Hold all erectile dysfunction medications at least 3 days (72 hrs) prior to test.  On the Night Before the Test:  Be sure to Drink plenty of water.  Do not consume any caffeinated/decaffeinated beverages or chocolate 12 hours prior to your test.  Do not take any antihistamines 12 hours prior to your test.  On the Day of the Test:  Drink plenty of water. Do not drink any water within one hour of the test.  Do not eat any food 4 hours prior to the test.  You may take your regular medications prior to the test.   Take metoprolol (Lopressor) two hours prior to test.  HOLD Furosemide/Hydrochlorothiazide morning of the test.       After the Test:  Drink plenty of water.  After receiving IV contrast, you may experience a mild flushed feeling. This is normal.  On occasion, you may experience a mild rash up to 24 hours after the test. This is not dangerous. If this occurs, you can take Benadryl 25 mg and increase your fluid intake.  If you experience trouble breathing, this can be serious. If it is severe call 911 IMMEDIATELY. If it is mild, please call our office.  If you take any of these medications: Glipizide/Metformin, Avandament, Glucavance, please do not take 48 hours after completing test unless otherwise instructed.   Once we have confirmed authorization from your insurance company, we will call you to set up a date and time for your test. Based on how quickly your insurance processes prior authorizations requests, please allow up to 4 weeks to be contacted for scheduling your Cardiac CT appointment. Be advised that routine Cardiac CT appointments could be scheduled as many as 8 weeks after your provider has ordered it.  For non-scheduling related questions, please contact the cardiac imaging nurse navigator  should you have any questions/concerns: Marchia Bond, Cardiac Imaging Nurse Navigator Burley Saver, Interim Cardiac Imaging Nurse [place] [facility] Direct Office Dial: [phone]   For scheduling needs, including cancellations and rescheduling, please call Vivien Rota at [phone], option 3.       Signed, Mertie Moores, MD  12/24/2019 5:47 PM    [place]

## 2020-01-01 ENCOUNTER — Ambulatory Visit (INDEPENDENT_AMBULATORY_CARE_PROVIDER_SITE_OTHER): Payer: 59 | Admitting: Family Medicine

## 2020-01-01 ENCOUNTER — Ambulatory Visit: Payer: 59 | Admitting: Family Medicine

## 2020-01-01 ENCOUNTER — Other Ambulatory Visit: Payer: Self-pay

## 2020-01-01 ENCOUNTER — Encounter: Payer: Self-pay | Admitting: Family Medicine

## 2020-01-01 VITALS — BP 118/82 | HR 75 | Temp 98.2°F | Ht 66.0 in | Wt 165.0 lb

## 2020-01-01 DIAGNOSIS — R079 Chest pain, unspecified: Secondary | ICD-10-CM

## 2020-01-01 DIAGNOSIS — E785 Hyperlipidemia, unspecified: Secondary | ICD-10-CM

## 2020-01-01 NOTE — Patient Instructions (Addendum)
Health Maintenance Due  Topic Date Due  . INFLUENZA VACCINE will get later in the year  12/02/2019   Definitely follow up with cardiology. If new or worsening symptoms try nitroglycerin and seek care.   Lets try omeprazole over the counter 20mg  twice daily before meals in case there is a reflux element to this.   Recommended follow up: keep October visit to check in or sooner if needed

## 2020-01-01 NOTE — Progress Notes (Signed)
Phone [phone] In person visit   Subjective:   Brett Ross is a 74 y.o. year old very pleasant male patient who presents for/with See problem oriented charting Chief Complaint  Patient presents with  . Chest Pain   This visit occurred during the SARS-CoV-2 public health emergency.  Safety protocols were in place, including screening questions prior to the visit, additional usage of staff PPE, and extensive cleaning of exam room while observing appropriate contact time as indicated for disinfecting solutions.   Past Medical History-  Patient Active Problem List   Diagnosis Date Noted  . Hyperlipidemia 02/06/2007    Priority: Medium  . Unstable angina ([place]) 12/24/2019  . History of retinal detachment 10/09/2019  . History of vitrectomy 10/09/2019  . Palpitations 11/22/2013    Medications- reviewed and updated Current Outpatient Medications  Medication Sig Dispense Refill  . aspirin EC 81 MG tablet Take 1 tablet (81 mg total) by mouth daily. Swallow whole. 90 tablet 3  . atorvastatin (LIPITOR) 10 MG tablet TAKE 1 TABLET BY MOUTH  DAILY 90 tablet 3  . Calcium Carbonate-Vit D-Min (CALTRATE 600+D PLUS) 600-400 MG-UNIT per tablet Chew 1 tablet by mouth daily.      . cetirizine (ZYRTEC) 10 MG tablet Take 10 mg by mouth daily.    . metoprolol tartrate (LOPRESSOR) 50 MG tablet Take one tablet by mouth 2 hours prior to CT 1 tablet 0  . Multiple Vitamins-Minerals (CENTRUM SILVER PO) Take by mouth daily.      . nitroGLYCERIN (NITROSTAT) 0.4 MG SL tablet Place 1 tablet (0.4 mg total) under the tongue every 5 (five) minutes as needed for chest pain. 25 tablet 3  . Omega-3 Fatty Acids (SALMON OIL-1000) 200 MG CAPS Take by mouth daily.       No current facility-administered medications for this visit.     Objective:  BP 118/82   Pulse 75   Temp 98.2 F (36.8 C) (Temporal)   Ht 5\' 6"  (1.676 m)   Wt 165 lb (74.8 kg)   SpO2 97%   BMI 26.63 kg/m  Gen: NAD, resting  comfortably CV: RRR no murmurs rubs or gallops No chest wall pain Lungs: CTAB no crackles, wheeze, rhonchi Abdomen: soft/nontender/nondistended/normal bowel sounds. No rebound or guarding.  Ext: no edema Skin: warm, dry     Assessment and Plan   # chest pain  S:started 12/14/2019.  Patient was initially seen by Dr. [place] Blocker on 12/21/2019.  Initial EKG was reassuring.  Due to family history (2 brothers with CAD around his age- one was a smoker ) Along with chest pain was referred to cardiology  Patient was able to be seen by cardiology within 3 days.Review of notes from Dr. Acie Fredrickson indicate patient with exertional chest pain with bruising walking well throughout driveway as well as episodes with mowing the lawn.  Plan was for coronary CT angiogram.   Patient reports symptoms are actually improved today.  He is taking aspirin 81 mg.  He also has nitroglycerin available which he has not used yet -he reports more of an upper abdomen/lower chest burning today with occasional taste of blood in his mouth. Staying propped up in the bed - not sure if it helps.  pepcid complete does not elp much. He does note some prior issues with mowin grass on incline which is new for him.  Worse in evening and after certain foods like ice cream.  A/P: With exertional component to his pain-told patient I strongly advised him to  follow through with the coronary CT angiogram.  I also advised him if chest pain worsens to try nitroglycerin.  He is concerned there may be a reflux element-that is certainly possible we still have to rule out cardiac cause.  I did recommend he take omeprazole 20 mg twice daily-he has 20 mg tablets available at home instead of 40 mg once daily which I was going to prescribe  #hyperlipidemia S: Medication: Atorvastatin 10 mg daily Lab Results  Component Value Date   CHOL 153 12/21/2019   HDL 55 12/21/2019   LDLCALC 70 12/21/2019   TRIG 228 (H) 12/21/2019   CHOLHDL 2.8 12/21/2019   A/P:  Reasonable control of LDL right at 70 but triglycerides are elevated-recently cardiology discussed dietary modifications to help improve levels-I agree with modification diet to improve triglycerides.  Continue atorvastatin 10 mg for now -Unfortunately hard to eat a healthier diet while caregiving for his wife with multiple sclerosis-does a lot of prepackaged meals which likely are negative for cholesterol  Recommended follow up: Keep October visit or see [place] sooner if needed Future Appointments  Date Time Provider [place]  01/10/2020  9:15 AM [facility]-CT 1 [facility]  02/12/2020  9:20 AM Marin Olp, MD [facility]  03/05/2020 10:15 AM Leanor Kail, PA [facility]  10/09/2020  3:45 PM Rankin, Clent Demark, MD [facility] None    Lab/Order associations:   ICD-10-CM   1. Chest pain, unspecified type  R07.9   2. Hyperlipidemia, unspecified hyperlipidemia type  E78.5    Return precautions advised.  Garret Reddish, MD

## 2020-01-09 ENCOUNTER — Telehealth (HOSPITAL_COMMUNITY): Payer: Self-pay | Admitting: Emergency Medicine

## 2020-01-09 NOTE — Telephone Encounter (Signed)
Reaching out to patient to offer assistance regarding upcoming cardiac imaging study; pt verbalizes understanding of appt date/time, parking situation and where to check in, pre-test NPO status and medications ordered, and verified current allergies; name and call back number provided for further questions should they arise Analeya Luallen RN Navigator Cardiac Imaging [place] [facility] [phone] office [phone] cell 

## 2020-01-10 ENCOUNTER — Ambulatory Visit (HOSPITAL_COMMUNITY)
Admission: RE | Admit: 2020-01-10 | Discharge: 2020-01-10 | Disposition: A | Discharge status: Home / Self Care | Payer: 59 | Source: Ambulatory Visit | Attending: Cardiovascular Disease | Admitting: Cardiovascular Disease

## 2020-01-10 ENCOUNTER — Other Ambulatory Visit: Payer: Self-pay

## 2020-01-10 DIAGNOSIS — R072 Precordial pain: Secondary | ICD-10-CM

## 2020-01-10 DIAGNOSIS — I2510 Atherosclerotic heart disease of native coronary artery without angina pectoris: Secondary | ICD-10-CM

## 2020-01-10 MED ORDER — NITROGLYCERIN 0.4 MG SL SUBL
0.8000 mg | SUBLINGUAL_TABLET | Freq: Once | SUBLINGUAL | Status: AC
  Administered 2020-01-10: 0.8 mg via SUBLINGUAL

## 2020-01-10 MED ORDER — IOHEXOL 350 MG/ML SOLN
80.0000 mL | Freq: Once | INTRAVENOUS | Status: AC | PRN
  Administered 2020-01-10: 80 mL via INTRAVENOUS

## 2020-01-10 MED ORDER — NITROGLYCERIN 0.4 MG SL SUBL
SUBLINGUAL_TABLET | SUBLINGUAL | Status: AC
  Filled 2020-01-10: qty 2

## 2020-01-11 ENCOUNTER — Telehealth: Payer: Self-pay | Admitting: *Deleted

## 2020-01-11 DIAGNOSIS — I2510 Atherosclerotic heart disease of native coronary artery without angina pectoris: Secondary | ICD-10-CM | POA: Diagnosis not present

## 2020-01-11 MED ORDER — ISOSORBIDE MONONITRATE ER 30 MG PO TB24: 30.0000 mg | ORAL_TABLET | Freq: Every day | ORAL | 3 refills | Status: DC

## 2020-01-11 NOTE — Telephone Encounter (Signed)
I spoke with patient who reports Dr Acie Fredrickson called and reviewed results with him.  Patient would like prescription for 90 day supply of Imdur sent to [facility] in [employer] on [address]. Will send in

## 2020-01-11 NOTE — Telephone Encounter (Signed)
-----   Message from Thayer Headings, MD sent at 01/11/2020  4:29 PM EDT ----- He has single vessel CAD ( distal RCA ) has not had any further episodes of chest pain .   We will continue with medical therapy for now and will have a low threshold to refer for cath if he has additional cp.  Is on metoprolol.  Please add Imdur 30 mg a day .   Chol levels look ok but his trigs are elevated.  I've advised him to avoid foods that are "white, wheat, or sweet"  He has an appt to see Vin in Nov.  He will call sooner  if he has any further episodes of cp

## 2020-01-16 NOTE — Telephone Encounter (Signed)
Scheduled patient with Dr. Acie Fredrickson 9/24.  He was grateful for call and agrees with treatment plan.

## 2020-01-16 NOTE — Telephone Encounter (Signed)
This encounter was created in error - please disregard.

## 2020-01-24 ENCOUNTER — Encounter: Payer: Self-pay | Admitting: Cardiovascular Disease

## 2020-01-24 NOTE — Progress Notes (Signed)
Cardiology Office Note:    Date:  02/04/2020   ID:  Brett Ross, DOB [birth year], MRN [id number]  PCP:  Marin Olp, MD  [facility] Cardiologist:  [place] Electrophysiologist:  None   Referring MD: Marin Olp, MD   Chief Complaint  Patient presents with  . Chest Pain    previous notes.    Brett Ross is a 74 y.o. male with a hx of chest pain , hyperlipidemia.   We were asked to see him today by Brett Flaming, MD for further evaluation of his chest pain .   prevously saw Dr. Johnsie Cancel  For past several months, has been waking up with arm tingling .  Saw his primary MD   Several weeks ago , he was pushing his wife in wheelchair , 100 + degree day .   Developed chest pain when he was through. Lasted 20 min or so.  + diaphoresis,    Has not felt well since that Went to primary MD ECG on Aug. 20, 2021 showed NSR ,  No ST or T wave changes No regular exercise except for mowing his yard.    Brother ( smoker) had extensive CAD  2nd brother also has significant CAD  Mother had cad Father died of leukemia at age 32.   Labs from 8/20 were reviewed  - trigs are 228.  Sept. 24, 2021   Brett Ross is seen today for follow up of his chest pain / UAP  Coronary CT angiogram revealed a Coronary calcium score of 476. 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  FFR revealed significant disease in the RCA.   He has been asymptomatic . Our plan was to continue medical therapy for now but have a low threshold to refer for cath if he has any additional CP  We tried Imdur but he had to stop it due to side effects.  He continues to have more chest pain , not quite as severe but more frequent  More chest pain if he lies on his left side.     Past Medical History:  Diagnosis Date  . Arthritis   . Detached retina    L 04/2014.   .  Environmental allergies   . HYPERLIPIDEMIA 02/06/2007    Past Surgical History:  Procedure Laterality Date  . cataract surgery     01/09/15 left. right 2018.   . INGUINAL HERNIA REPAIR     bilateral  . KNEE ARTHROSCOPY  2007   left  . VITRECTOMY     06/2014 left  . VITRECTOMY     10/2017    Current Medications: Current Meds  Medication Sig  . aspirin EC 81 MG tablet Take 1 tablet (81 mg total) by mouth daily. Swallow whole.  Marland Kitchen atorvastatin (LIPITOR) 10 MG tablet TAKE 1 TABLET BY MOUTH  DAILY  . Calcium Carbonate-Vit D-Min (CALTRATE 600+D PLUS) 600-400 MG-UNIT per tablet Chew 1 tablet by mouth daily.    . cetirizine (ZYRTEC) 10 MG tablet Take 10 mg by mouth daily.  . Multiple Vitamins-Minerals (CENTRUM SILVER PO) Take by mouth daily.    . nitroGLYCERIN (NITROSTAT) 0.4 MG SL tablet Place 1 tablet (0.4 mg total) under the tongue every 5 (five) minutes as needed for chest pain.  . Omega-3 Fatty Acids (SALMON OIL-1000) 200 MG CAPS Take by mouth daily.    Marland Kitchen  pantoprazole (PROTONIX) 20 MG tablet Take 20 mg by mouth 2 (two) times daily.     Allergies:   Caffeine, Sulfites, and Adhesive [tape]   Social History   Socioeconomic History  . Marital status: Married    Spouse name: Not on file  . Number of children: Not on file  . Years of education: Not on file  . Highest education level: Not on file  Occupational History  . Not on file  Tobacco Use  . Smoking status: Never Smoker  . Smokeless tobacco: Never Used  Substance and Sexual Activity  . Alcohol use: Yes    Alcohol/week: 7.0 standard drinks    Types: 7 Cans of beer per week  . Drug use: No  . Sexual activity: Not on file  Other Topics Concern  . Not on file  Social History Narrative   Married and has a step son.       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       Enjoys family time   Social Determinants of Adult nurse Strain:   . Difficulty of Paying Living Expenses: Not on file  Food Insecurity:   . Worried About Charity fundraiser in the Last Year: Not on file  . Ran Out of Food in the Last Year: Not on file  Transportation Needs:   . Lack of Transportation (Medical): Not on file  . Lack of Transportation (Non-Medical): Not on file  Physical Activity:   . Days of Exercise per Week: Not on file  . Minutes of Exercise per Session: Not on file  Stress:   . Feeling of Stress : Not on file  Social Connections:   . Frequency of Communication with Friends and Family: Not on file  . Frequency of Social Gatherings with Friends and Family: Not on file  . Attends Religious Services: Not on file  . Active Member of Clubs or Organizations: Not on file  . Attends Archivist Meetings: Not on file  . Marital Status: Not on file     Family History: The patient's family history includes Heart disease (age of onset: 24) in his brother; Heart disease (age of onset: 66) in his brother; Heart failure in his mother; Leukemia in his father; Melanoma in his brother. There is no history of Colon cancer, Rectal cancer, or Stomach cancer.  ROS:   Please see the history of present illness.     All other systems reviewed and are negative.  EKGs/Labs/Other Studies Reviewed:    The following studies were reviewed today:   EKG:   12/21/19 :    NSR .  No ST or T wave changes.   Recent Labs: 12/21/2019: ALT 16; BUN 15; Creat 0.96; Hemoglobin 15.2; Platelets 226; Potassium 4.6; Sodium 140; TSH 1.15  Recent Lipid Panel    Component Value Date/Time   CHOL 153 12/21/2019 1356   TRIG 228 (H) 12/21/2019 1356   HDL 55 12/21/2019 1356   CHOLHDL 2.8 12/21/2019 1356   VLDL 22.8 01/30/2019 1036   LDLCALC 70 12/21/2019 1356    Physical Exam:     Physical Exam: Blood pressure 122/72, pulse 66, height 5\' 2"  (1.575 m), weight 162 lb 6.4 oz (73.7 kg), SpO2 96 %.  GEN:  Well nourished, well  developed in no acute distress HEENT: Normal NECK: No JVD; No  carotid bruits LYMPHATICS: No lymphadenopathy CARDIAC: RRR , no murmurs, rubs, gallops RESPIRATORY:  Clear to auscultation without rales, wheezing or rhonchi  ABDOMEN: Soft, non-tender, non-distended MUSCULOSKELETAL:  No edema; No deformity  SKIN: Warm and dry NEUROLOGIC:  Alert and oriented x 3   ASSESSMENT:    1. Precordial pain   2. Hyperlipidemia LDL goal <70    PLAN:    1.  Unstable angina:   He is not having much angina at this point but he admits that he is not exercising.  I have advised him to walk or ride his stationary bicycle for at least 45 minutes 3 days a week.  This will allow [place] a chance to see if he is having any exertional angina symptoms.  I will see him again in 4 months for follow-up visit.  If at that time he still relatively pain-free then we will continue with medical therapy.  If he is having angina with exertion then we will proceed with heart cath.  We tried isosorbide but he had lots of side effects and did not tolerate it.  His heart rate is fairly slow so I do not think that he will tolerate beta-blockers.  2.  Hyperlipidemia: Check lipids, liver enzymes, basic metabolic profile in 4 months.   2.  Hyperlipidemia:     Medication Adjustments/Labs and Tests Ordered: Current medicines are reviewed at length with the patient today.  Concerns regarding medicines are outlined above.  Orders Placed This Encounter  Procedures  . Lipid Profile  . Basic Metabolic Panel (BMET)  . Hepatic function panel   No orders of the defined types were placed in this encounter.   Patient Instructions  Medication Instructions:  Your physician recommends that you continue on your current medications as directed. Please refer to the Current Medication list given to you today.  *If you need a refill on your cardiac medications before your next appointment, please call your pharmacy*   Lab Work: Your  physician recommends that you return for lab work in: 3 months on January 20 at 7:45 am You will need to FAST for this appointment - nothing to eat or drink after midnight the night before except water.  If you have labs (blood work) drawn today and your tests are completely normal, you will receive your results only by: Marland Kitchen MyChart Message (if you have MyChart) OR . A paper copy in the mail If you have any lab test that is abnormal or we need to change your treatment, we will call you to review the results.   Testing/Procedures: None Ordered    Follow-Up: At [facility], you and your health needs are our priority.  As part of our continuing mission to provide you with exceptional heart care, we have created designated Provider Care Teams.  These Care Teams include your primary Cardiologist (physician) and Advanced Practice Providers (APPs -  Physician Assistants and Nurse Practitioners) who all work together to provide you with the care you need, when you need it.  Your next appointment:   4 month(s) on January 24 at 8:00 am  The format for your next appointment:   In Person  Provider:   Mertie Moores, MD        Signed, Mertie Moores, MD  02/04/2020 5:51 PM    [place]

## 2020-01-25 ENCOUNTER — Other Ambulatory Visit: Payer: Self-pay

## 2020-01-25 ENCOUNTER — Ambulatory Visit (INDEPENDENT_AMBULATORY_CARE_PROVIDER_SITE_OTHER): Payer: 59 | Admitting: Cardiovascular Disease

## 2020-01-25 ENCOUNTER — Encounter: Payer: Self-pay | Admitting: Cardiovascular Disease

## 2020-01-25 VITALS — BP 122/72 | HR 66 | Ht 62.0 in | Wt 162.4 lb

## 2020-01-25 DIAGNOSIS — E785 Hyperlipidemia, unspecified: Secondary | ICD-10-CM | POA: Diagnosis not present

## 2020-01-25 DIAGNOSIS — I200 Unstable angina: Secondary | ICD-10-CM

## 2020-01-25 DIAGNOSIS — R072 Precordial pain: Secondary | ICD-10-CM

## 2020-01-25 NOTE — Patient Instructions (Addendum)
Medication Instructions:  Your physician recommends that you continue on your current medications as directed. Please refer to the Current Medication list given to you today.  *If you need a refill on your cardiac medications before your next appointment, please call your pharmacy*   Lab Work: Your physician recommends that you return for lab work in: 3 months on January 20 at 7:45 am You will need to FAST for this appointment - nothing to eat or drink after midnight the night before except water.  If you have labs (blood work) drawn today and your tests are completely normal, you will receive your results only by: Marland Kitchen MyChart Message (if you have MyChart) OR . A paper copy in the mail If you have any lab test that is abnormal or we need to change your treatment, we will call you to review the results.   Testing/Procedures: None Ordered    Follow-Up: At [facility], you and your health needs are our priority.  As part of our continuing mission to provide you with exceptional heart care, we have created designated Provider Care Teams.  These Care Teams include your primary Cardiologist (physician) and Advanced Practice Providers (APPs -  Physician Assistants and Nurse Practitioners) who all work together to provide you with the care you need, when you need it.  Your next appointment:   4 month(s) on January 24 at 8:00 am  The format for your next appointment:   In Person  Provider:   Mertie Moores, MD

## 2020-02-11 NOTE — Patient Instructions (Addendum)
Health Maintenance Due  Topic Date Due  . TETANUS/TDAP schedule visit in perhaps two weeks after covid booster for both shots.  10/08/2018  . INFLUENZA VACCINE schedule visit in perhaps two weeks after covid booster for both shots.  12/02/2019   We will call you within two weeks about your referral to dermatology for skin cancer screening (show them spot in right groin and tell them enlarging). If you do not hear within 3 weeks, give [place] a call.   Skip labs since had in august and will get again in January with cardiology

## 2020-02-11 NOTE — Progress Notes (Signed)
Phone: [phone]   Subjective:  Patient presents today for their annual physical. Chief complaint-noted.   See problem oriented charting- ROS- full  review of systems was completed and negative  except for: tinnitus, chest pain see below, shortness of breath - after pushing wife up driveway in heat, low back pain, seasonal allergies  The following were reviewed and entered/updated in epic: Past Medical History:  Diagnosis Date  . Arthritis   . Detached retina    L 04/2014.   . Environmental allergies   . HYPERLIPIDEMIA 02/06/2007   Patient Active Problem List   Diagnosis Date Noted  . Hyperlipidemia 02/06/2007    Priority: Medium  . Unstable angina ([place]) 12/24/2019  . History of retinal detachment 10/09/2019  . History of vitrectomy 10/09/2019  . Palpitations 11/22/2013   Past Surgical History:  Procedure Laterality Date  . cataract surgery     01/09/15 left. right 2018.   . INGUINAL HERNIA REPAIR     bilateral  . KNEE ARTHROSCOPY  2007   left  . VITRECTOMY     06/2014 left  . VITRECTOMY     10/2017    Family History  Problem Relation Age of Onset  . Melanoma Brother   . Heart disease Brother 17       mi, stent  . Heart disease Brother 31       MI-required stent  . Heart failure Mother        CHF and heart disease; had breast cancer in 72s  . Leukemia Father   . Colon cancer Neg Hx   . Rectal cancer Neg Hx   . Stomach cancer Neg Hx     Medications- reviewed and updated Current Outpatient Medications  Medication Sig Dispense Refill  . aspirin EC 81 MG tablet Take 1 tablet (81 mg total) by mouth daily. Swallow whole. 90 tablet 3  . atorvastatin (LIPITOR) 10 MG tablet TAKE 1 TABLET BY MOUTH  DAILY 90 tablet 3  . Calcium Carbonate-Vit D-Min (CALTRATE 600+D PLUS) 600-400 MG-UNIT per tablet Chew 1 tablet by mouth daily.      Marland Kitchen loratadine (CLARITIN) 10 MG tablet Take 10 mg by mouth daily.    . Multiple Vitamins-Minerals (CENTRUM SILVER PO) Take by mouth daily.       . nitroGLYCERIN (NITROSTAT) 0.4 MG SL tablet Place 1 tablet (0.4 mg total) under the tongue every 5 (five) minutes as needed for chest pain. 25 tablet 3  . Omega-3 Fatty Acids (SALMON OIL-1000) 200 MG CAPS Take by mouth daily.      . pantoprazole (PROTONIX) 20 MG tablet Take 20 mg by mouth 2 (two) times daily.    . fluticasone (FLONASE) 50 MCG/ACT nasal spray Place 2 sprays into both nostrils daily. 48 g 3   No current facility-administered medications for this visit.    Allergies-reviewed and updated Allergies  Allergen Reactions  . Caffeine     REACTION: tinnitis  . Sulfites     Per patient he has difficulty sleeping  . Adhesive [Tape] Rash    Social History   Social History Narrative   Married and has a step son.       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       Enjoys family time   Objective  Objective:  BP 122/76   Pulse (!) 59   Temp 97.8 F (36.6  C) (Temporal)   Resp 18   Ht 5\' 2"  (1.575 m)   Wt 160 lb 6.4 oz (72.8 kg)   SpO2 99%   BMI 29.34 kg/m  Gen: NAD, resting comfortably HEENT: Mucous membranes are moist. Oropharynx normal Neck: no thyromegaly CV: RRR no murmurs rubs or gallops Lungs: CTAB no crackles, wheeze, rhonchi Abdomen: soft/nontender/nondistended/normal bowel sounds. No rebound or guarding.  Ext: no edema Skin: warm, dry Neuro: grossly normal, moves all extremities, PERRLA    Assessment and Plan  74 y.o. male presenting for annual physical.  Health Maintenance counseling: 1. Anticipatory guidance: Patient counseled regarding regular dental exams --q6 months, eye exams yearly with each specialist Dr. Ellie Lunch and retinal specialist,  avoiding smoking and second hand smoke, limiting alcohol to 2 beverages per day- usually under 1  2. Risk factor reduction:  Advised patient of need for regular exercise and diet rich and fruits and  vegetables to reduce risk of heart attack and stroke. Exercise- limited recently very busy caring for wife. Does get a good number of steps a day as works upstairs- still Immunologist as well (not getting chest pain with mowing). Diet-has lost a few lbs wants to target 150.  Wt Readings from Last 3 Encounters:  02/12/20 160 lb 6.4 oz (72.8 kg)  01/25/20 162 lb 6.4 oz (73.7 kg)  01/01/20 165 lb (74.8 kg)  3. Immunizations/screenings/ancillary studies- flu shot today . Tdap today .  Immunization History  Administered Date(s) Administered  . Fluad Quad(high Dose 65+) 01/30/2019  . H1N1 04/16/2008  . Influenza Whole 02/01/2008, 02/04/2009, 02/01/2011  . Influenza, High Dose Seasonal PF 01/26/2016, 01/17/2017, 02/16/2018  . Influenza,inj,Quad PF,6+ Mos 01/17/2015  . Influenza-Unspecified 02/14/2014  . PFIZER SARS-COV-2 Vaccination 06/29/2019, 07/24/2019  . Pneumococcal Conjugate-13 01/17/2015  . Pneumococcal Polysaccharide-23 09/18/2012  . Td 10/07/2008  . Zoster 06/11/2011  . Zoster Recombinat (Shingrix) 12/23/2017, 03/17/2018  4. Prostate cancer screening- we got 1 final PSA last year-stable trend and opted to discontinue screening.  Nocturia stable once a night and no other change in urinary symptoms Lab Results  Component Value Date   PSA 1.26 01/30/2019   PSA 1.62 12/19/2017   PSA 1.27 12/14/2016   5. Colon cancer screening - October 2012 with 10-year follow-up planned 6. Skin cancer screening-no dermatologist. advised regular sunscreen use. Denies worrisome, changing, or new skin lesions other than slightly enlarging SK in right groin 7.  Never smoker  Status of chronic or acute concerns   # ongoing caregiving role- caring for his wife pretty constantly  #Exertional chest pain-has seen Dr. Acie Fredrickson as recently as January 25, 2020.  Plan is to do heart catheterization if having angina with exertion but he has not been as active recently.  He did not tolerate isosorbide mononitrate.  He  did not think he would tolerate beta-blockers.  Plan is for a month follow-up or sooner if worsening symptoms-has been recommended to do stationary bike at least 45 minutes 3 days a week.  Did have CT coronary with nonobstructive disease in the left main, left anterior descending and left circumflex, doe shave severe disease in osteal right PDA . Has not had nitroglycerin at home.   #hyperlipidemia S: Medication: atorvastatin 10Mg , Omega 3 200Mg   Lab Results  Component Value Date   CHOL 153 12/21/2019   HDL 55 12/21/2019   LDLCALC 70 12/21/2019   TRIG 228 (H) 12/21/2019   CHOLHDL 2.8 12/21/2019   A/P: Reasonable control when checked 2 months ago with LDL  at 70.  Triglycerides slightly high but I do not believe he was fasting. Plus has labs planned with cardiology in January and he is going to work on his diet.   #GERD-omeprazole lately in evening- pepcid wasn't strong enough.   #Allergies-takes Claritin before bed with reasonable control. Likes to have flonase as backup for after yardwork in particular- refill today   #Low back pain-referred back to Dr. French Ana last year. Gave script for PT but he has not had time. Strain is coming from positioning wife in chair and this cannot really be adjusted. Follow up with Dr. French Ana if worsens  Recommended follow up: Return in about 1 year (around 02/11/2021) for physical or sooner if needed. Future Appointments  Date Time Provider [place]  02/16/2020 10:30 AM [place] [facility]  05/22/2020  7:45 AM [facility]  05/26/2020  8:00 AM Nahser, Wonda Cheng, MD [facility]  10/09/2020  3:45 PM [place], Clent Demark, MD [facility] None   Lab/Order associations: fasting   ICD-10-CM   1. Preventative health care  Z00.00   2. Hyperlipidemia, unspecified hyperlipidemia type  E78.5   3. Screening exam for skin cancer  Z12.83 Ambulatory referral to Dermatology   Return precautions advised.   Garret Reddish, MD

## 2020-02-12 ENCOUNTER — Ambulatory Visit (INDEPENDENT_AMBULATORY_CARE_PROVIDER_SITE_OTHER): Payer: 59 | Admitting: Family Medicine

## 2020-02-12 ENCOUNTER — Other Ambulatory Visit: Payer: Self-pay

## 2020-02-12 ENCOUNTER — Encounter: Payer: Self-pay | Admitting: Family Medicine

## 2020-02-12 VITALS — BP 122/76 | HR 59 | Temp 97.8°F | Resp 18 | Ht 62.0 in | Wt 160.4 lb

## 2020-02-12 DIAGNOSIS — Z1283 Encounter for screening for malignant neoplasm of skin: Secondary | ICD-10-CM | POA: Diagnosis not present

## 2020-02-12 DIAGNOSIS — E785 Hyperlipidemia, unspecified: Secondary | ICD-10-CM

## 2020-02-12 DIAGNOSIS — Z0000 Encounter for general adult medical examination without abnormal findings: Secondary | ICD-10-CM

## 2020-02-12 MED ORDER — FLUTICASONE PROPIONATE 50 MCG/ACT NA SUSP: 2.0000 | Freq: Every day | NASAL | 3 refills | Status: DC

## 2020-02-16 ENCOUNTER — Ambulatory Visit: Payer: 59 | Attending: Internal Medicine

## 2020-02-16 ENCOUNTER — Other Ambulatory Visit: Payer: Self-pay

## 2020-02-16 DIAGNOSIS — Z23 Encounter for immunization: Secondary | ICD-10-CM

## 2020-02-16 NOTE — Progress Notes (Signed)
   Covid-19 Vaccination Clinic  Name:  Brett Ross    MRN: [id number] DOB: February 04, 1946  02/16/2020  Mr. Brett Ross was observed post Covid-19 immunization for 15 minutes without incident. He was provided with Vaccine Information Sheet and instruction to access the V-Safe system.   Mr. Brett Ross was instructed to call 911 with any severe reactions post vaccine: Marland Kitchen Difficulty breathing  . Swelling of face and throat  . A fast heartbeat  . A bad rash all over body  . Dizziness and weakness

## 2020-03-04 ENCOUNTER — Encounter: Payer: Self-pay | Admitting: Family Medicine

## 2020-03-04 ENCOUNTER — Ambulatory Visit (INDEPENDENT_AMBULATORY_CARE_PROVIDER_SITE_OTHER): Payer: 59

## 2020-03-04 ENCOUNTER — Other Ambulatory Visit: Payer: Self-pay

## 2020-03-04 DIAGNOSIS — Z23 Encounter for immunization: Secondary | ICD-10-CM | POA: Diagnosis not present

## 2020-03-05 ENCOUNTER — Ambulatory Visit: Payer: 59 | Admitting: Physician Assistant

## 2020-04-02 HISTORY — PX: CORONARY ANGIOPLASTY WITH STENT PLACEMENT: SHX49

## 2020-04-28 ENCOUNTER — Other Ambulatory Visit: Payer: Self-pay

## 2020-05-22 ENCOUNTER — Other Ambulatory Visit: Payer: Self-pay

## 2020-05-22 ENCOUNTER — Other Ambulatory Visit: Payer: 59 | Admitting: *Deleted

## 2020-05-22 DIAGNOSIS — E785 Hyperlipidemia, unspecified: Secondary | ICD-10-CM

## 2020-05-22 DIAGNOSIS — R072 Precordial pain: Secondary | ICD-10-CM

## 2020-05-22 LAB — LIPID PANEL
Chol/HDL Ratio: 2.8 ratio (ref 0.0–5.0)
Cholesterol, Total: 165 mg/dL (ref 100–199)
HDL: 60 mg/dL (ref >39)
LDL Chol Calc (NIH): 86 mg/dL (ref 0–99)
Triglycerides: 107 mg/dL (ref 0–149)
VLDL Cholesterol Cal: 19 mg/dL (ref 5–40)

## 2020-05-22 LAB — BASIC METABOLIC PANEL
BUN/Creatinine Ratio: 10 (ref 10–24)
BUN: 11 mg/dL (ref 8–27)
CO2: 26 mmol/L (ref 20–29)
Calcium: 9.3 mg/dL (ref 8.6–10.2)
Chloride: 105 mmol/L (ref 96–106)
Creatinine, Ser: 1.06 mg/dL (ref 0.76–1.27)
GFR calc Af Amer: 81 mL/min/{1.73_m2} (ref >59)
GFR calc non Af Amer: 70 mL/min/{1.73_m2} (ref >59)
Glucose: 90 mg/dL (ref 65–99)
Potassium: 4.1 mmol/L (ref 3.5–5.2)
Sodium: 144 mmol/L (ref 134–144)

## 2020-05-22 LAB — HEPATIC FUNCTION PANEL
ALT: 18 IU/L (ref 0–44)
AST: 16 IU/L (ref 0–40)
Albumin: 4.4 g/dL (ref 3.7–4.7)
Alkaline Phosphatase: 53 IU/L (ref 44–121)
Bilirubin Total: 0.8 mg/dL (ref 0.0–1.2)
Bilirubin, Direct: 0.19 mg/dL (ref 0.00–0.40)
Total Protein: 6.7 g/dL (ref 6.0–8.5)

## 2020-05-25 ENCOUNTER — Encounter: Payer: Self-pay | Admitting: Cardiovascular Disease

## 2020-05-25 NOTE — Progress Notes (Signed)
Cardiology Office Note:    Date:  05/26/2020   ID:  Brett Ross, DOB 02-06-1946, MRN [id number]  PCP:  Brett Olp, MD  [facility] Cardiologist:  Brett Ross:  None   Referring MD: Brett Olp, MD   Chief Complaint  Patient presents with  . Chest Pain    previous notes.    Brett Ross is a 75 y.o. male with a hx of chest pain , hyperlipidemia.   We were asked to see him today by Brett Flaming, MD for further evaluation of his chest pain .   prevously saw Dr. Johnsie Cancel  For past several months, has been waking up with arm tingling .  Saw his primary MD   Several weeks ago , he was pushing his wife in wheelchair , 100 + degree day .   Developed chest pain when he was through. Lasted 20 min or so.  + diaphoresis,    Has not felt well since that Went to primary MD ECG on Aug. 20, 2021 showed NSR ,  No ST or T wave changes No regular exercise except for mowing his yard.    Brother ( smoker) had extensive CAD  2nd brother also has significant CAD  Mother had cad Father died of leukemia at age 65.   Labs from 8/20 were reviewed  - trigs are 228.  Sept. 24, 2021   [name] is seen today for follow up of his chest pain / UAP  Coronary CT angiogram revealed a Coronary calcium score of 476. 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  FFR revealed significant disease in the RCA.   He has been asymptomatic . Our plan was to continue medical therapy for now but have a low threshold to refer for cath if he has any additional CP  We tried Imdur but he had to stop it due to side effects.  He continues to have more chest pain , not quite as severe but more frequent  More chest pain if he lies on his left side.   Jan. 24, 2022 Brett Ross is seen today for follow up of his CAD  Coronary CT angiogram revealed a Coronary  calcium score of 476. 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  FFR revealed significant disease in the RCA.   He has been asymptomatic. He saw a cardiologist in [place] and his a stent placed in his RCA  ( Dec. 22, 2021)  Is on Effient and ASA He was not having any additional symptoms  Labs from last week were reviewed.  His total cholesterol is 165.  HDL is 60.  Triglycerides are 107.  LDL is 86.  Past Medical History:  Diagnosis Date  . Arthritis   . Detached retina    L 04/2014.   . Environmental allergies   . HYPERLIPIDEMIA 02/06/2007    Past Surgical History:  Procedure Laterality Date  . cataract surgery     01/09/15 left. right 2018.   . INGUINAL HERNIA REPAIR     bilateral  . KNEE ARTHROSCOPY  2007   left  . VITRECTOMY     06/2014 left  . VITRECTOMY     10/2017    Current Medications: Current Meds  Medication Sig  .  aspirin EC 81 MG tablet Take 1 tablet (81 mg total) by mouth daily. Swallow whole.  . Calcium Carbonate-Vit D-Min (CALTRATE 600+D PLUS) 600-400 MG-UNIT per tablet Chew 1 tablet by mouth daily.  . fluticasone (FLONASE) 50 MCG/ACT nasal spray Place 2 sprays into both nostrils daily.  . fluticasone (FLOVENT DISKUS) 50 MCG/BLIST diskus inhaler Inhale 1 puff into the lungs as needed.  . loratadine (CLARITIN) 10 MG tablet Take 10 mg by mouth daily.  . metoprolol succinate (TOPROL-XL) 25 MG 24 hr tablet Take 12.5 mg by mouth daily.  . Multiple Vitamins-Minerals (CENTRUM SILVER PO) Take by mouth daily.  . Omega-3 Fatty Acids (SALMON OIL-1000) 200 MG CAPS Take by mouth daily.  . pantoprazole (PROTONIX) 20 MG tablet Take 20 mg by mouth 2 (two) times daily.  . prasugrel (EFFIENT) 10 MG TABS tablet Take 10 mg by mouth daily.  . rosuvastatin (CRESTOR) 20 MG tablet Take 1 tablet (20 mg total) by mouth daily.  . [DISCONTINUED] atorvastatin (LIPITOR) 10 MG tablet TAKE 1  TABLET BY MOUTH  DAILY  . [DISCONTINUED] atorvastatin (LIPITOR) 20 MG tablet Take 20 mg by mouth daily.     Allergies:   Caffeine, Sulfites, and Adhesive [tape]   Social History   Socioeconomic History  . Marital status: Married    Spouse name: Not on file  . Number of children: Not on file  . Years of education: Not on file  . Highest education level: Not on file  Occupational History  . Not on file  Tobacco Use  . Smoking status: Never Smoker  . Smokeless tobacco: Never Used  Substance and Sexual Activity  . Alcohol use: Yes    Alcohol/week: 7.0 standard drinks    Types: 7 Cans of beer per week  . Drug use: No  . Sexual activity: Not on file  Other Topics Concern  . Not on file  Social History Narrative   Married and has a step son.       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       Enjoys family time   Social Determinants of Radio broadcast assistant Strain: Not on file  Food Insecurity: Not on file  Transportation Needs: Not on file  Physical Activity: Not on file  Stress: Not on file  Social Connections: Not on file     Family History: The patient's family history includes Heart disease (age of onset: 8) in his brother; Heart disease (age of onset: 2) in his brother; Heart failure in his mother; Leukemia in his father; Melanoma in his brother. There is no history of Colon cancer, Rectal cancer, or Stomach cancer.  ROS:   Please see the history of present illness.     All other systems reviewed and are negative.  EKGs/Labs/Other Studies Reviewed:    The following studies were reviewed today:   EKG:      Recent Labs: 12/21/2019: Hemoglobin 15.2; Platelets 226; TSH 1.15 05/22/2020: ALT 18; BUN 11; Creatinine, Ser 1.06; Potassium 4.1; Sodium 144  Recent Lipid Panel    Component Value Date/Time   CHOL 165 05/22/2020 0823   TRIG 107 05/22/2020  0823   HDL 60 05/22/2020 0823   CHOLHDL 2.8 05/22/2020 0823   CHOLHDL 2.8 12/21/2019 1356   VLDL 22.8 01/30/2019 1036   LDLCALC 86 05/22/2020 0823   LDLCALC 70 12/21/2019 1356  Physical Exam:      Physical Exam: Blood pressure 120/64, pulse 60, height 5\' 6"  (1.676 m), weight 160 lb 9.6 oz (72.8 kg), SpO2 98 %.  GEN:  Well nourished, well developed in no acute distress HEENT: Normal NECK: No JVD; No carotid bruits LYMPHATICS: No lymphadenopathy CARDIAC: RRR   RESPIRATORY:  Clear to auscultation without rales, wheezing or rhonchi  ABDOMEN: Soft, non-tender, non-distended MUSCULOSKELETAL:  No edema; No deformity  SKIN: Warm and dry NEUROLOGIC:  Alert and oriented x 3    ASSESSMENT:    1. Hyperlipidemia LDL goal <70   2. Unstable angina (HCC)    PLAN:    1.  Coronary artery disease: He was seen by cardiologist in [place] who decided that he should have a stent.  He is not having any worsening symptoms.  He had PCI and stenting of his right coronary artery.  He is currently on aspirin and Effient.  The plan is to continue both aspirin and Effient until December of next year.  At that point we will change the Effient to Plavix and discontinue the aspirin.:      2.  Hyperlipidemia: His lipid  are overall fairly well controlled but his LDL is still 86.  Our goal for his LDL is between 50 and 70.  We will discontinue the atorvastatin and start him on rosuvastatin 20 mg a day.  We will check lipids, liver enzymes, basic metabolic profile in 3 months.    Medication Adjustments/Labs and Tests Ordered: Current medicines are reviewed at length with the patient today.  Concerns regarding medicines are outlined above.  Orders Placed This Encounter  Procedures  . Hepatic function panel  . Basic metabolic panel  . Lipid panel   Meds ordered this encounter  Medications  . rosuvastatin (CRESTOR) 20 MG tablet    Sig: Take 1 tablet (20 mg total) by mouth daily.    Dispense:  90  tablet    Refill:  3    Patient Instructions  Medication Instructions:  Your physician has recommended you make the following change in your medication:  1) STOP taking atorvastatin  2) START taking Crestor (rosuvastatin) 20 mg daily   *If you need a refill on your cardiac medications before your next appointment, please call your pharmacy*   Lab Work: Fasting lipids, liver function test, and BMET in 3 months.   Follow-Up: At [facility], you and your health needs are our priority.  As part of our continuing mission to provide you with exceptional heart care, we have created designated Provider Care Teams.  These Care Teams include your primary Cardiologist (physician) and Advanced Practice Providers (APPs -  Physician Assistants and Nurse Practitioners) who all work together to provide you with the care you need, when you need it.  Your next appointment:   1 year(s)  The format for your next appointment:   In Person  Provider:   You may see Mertie Moores, MD or one of the following Advanced Practice Providers on your designated Care Team:    Richardson Dopp, PA-C  Robbie Lis, [place]      Signed, Mertie Moores, MD  05/26/2020 8:37 AM    [place]

## 2020-05-26 ENCOUNTER — Telehealth: Payer: Self-pay

## 2020-05-26 ENCOUNTER — Ambulatory Visit (INDEPENDENT_AMBULATORY_CARE_PROVIDER_SITE_OTHER): Payer: 59 | Admitting: Cardiovascular Disease

## 2020-05-26 ENCOUNTER — Other Ambulatory Visit: Payer: Self-pay

## 2020-05-26 ENCOUNTER — Encounter: Payer: Self-pay | Admitting: Cardiovascular Disease

## 2020-05-26 VITALS — BP 120/64 | HR 60 | Ht 66.0 in | Wt 160.6 lb

## 2020-05-26 DIAGNOSIS — I200 Unstable angina: Secondary | ICD-10-CM | POA: Diagnosis not present

## 2020-05-26 DIAGNOSIS — E785 Hyperlipidemia, unspecified: Secondary | ICD-10-CM

## 2020-05-26 DIAGNOSIS — I2510 Atherosclerotic heart disease of native coronary artery without angina pectoris: Secondary | ICD-10-CM | POA: Diagnosis not present

## 2020-05-26 MED ORDER — ROSUVASTATIN CALCIUM 20 MG PO TABS: 20.0000 mg | ORAL_TABLET | Freq: Every day | ORAL | 3 refills | Status: DC

## 2020-05-26 NOTE — Patient Instructions (Signed)
Medication Instructions:  Your physician has recommended you make the following change in your medication:  1) STOP taking atorvastatin  2) START taking Crestor (rosuvastatin) 20 mg daily   *If you need a refill on your cardiac medications before your next appointment, please call your pharmacy*   Lab Work: Fasting lipids, liver function test, and BMET in 3 months.   Follow-Up: At [facility], you and your health needs are our priority.  As part of our continuing mission to provide you with exceptional heart care, we have created designated Provider Care Teams.  These Care Teams include your primary Cardiologist (physician) and Advanced Practice Providers (APPs -  Physician Assistants and Nurse Practitioners) who all work together to provide you with the care you need, when you need it.  Your next appointment:   1 year(s)  The format for your next appointment:   In Person  Provider:   You may see Mertie Moores, MD or one of the following Advanced Practice Providers on your designated Care Team:    Richardson Dopp, PA-C  [place], [place]

## 2020-05-26 NOTE — Telephone Encounter (Signed)
-----   Message from Thayer Headings, MD sent at 05/23/2020  4:45 PM EST ----- Chol levels are better but his LDL is 86.   Goal is < 70 Lets DC atorvastatin , Start rosuvastatin 10 mg day  Check lipids , liver enz, BMP in in 3 months

## 2020-05-26 NOTE — Telephone Encounter (Signed)
The patient has been notified of the result and the medication changes, pt verbalized understanding.  All questions (if any) were answered. Explained to the pt that his lipids and liver enz, BMP would be checked in 3 months.  Edman Circle, RN 05/26/2020 1:55 PM   Addendum Pt seen in the office today, meds and labs orders handled at visit.

## 2020-05-27 NOTE — Telephone Encounter (Signed)
Faxed request for records this morning

## 2020-06-05 ENCOUNTER — Other Ambulatory Visit: Payer: Self-pay

## 2020-06-05 MED ORDER — PRASUGREL HCL 10 MG PO TABS
10.0000 mg | ORAL_TABLET | Freq: Every day | ORAL | 3 refills | Status: DC
Start: 2020-06-05 — End: 2021-03-30

## 2020-06-05 MED ORDER — METOPROLOL SUCCINATE ER 25 MG PO TB24
12.5000 mg | ORAL_TABLET | Freq: Every day | ORAL | 3 refills | Status: DC
Start: 2020-06-05 — End: 2021-03-30

## 2020-07-15 ENCOUNTER — Ambulatory Visit: Payer: 59 | Admitting: Dermatology

## 2020-08-18 ENCOUNTER — Other Ambulatory Visit: Payer: Self-pay

## 2020-08-18 ENCOUNTER — Ambulatory Visit (INDEPENDENT_AMBULATORY_CARE_PROVIDER_SITE_OTHER): Payer: 59 | Admitting: Dermatology

## 2020-08-18 DIAGNOSIS — R202 Paresthesia of skin: Secondary | ICD-10-CM

## 2020-08-18 DIAGNOSIS — L905 Scar conditions and fibrosis of skin: Secondary | ICD-10-CM

## 2020-08-18 DIAGNOSIS — D229 Melanocytic nevi, unspecified: Secondary | ICD-10-CM

## 2020-08-18 DIAGNOSIS — Z1283 Encounter for screening for malignant neoplasm of skin: Secondary | ICD-10-CM

## 2020-08-18 DIAGNOSIS — L821 Other seborrheic keratosis: Secondary | ICD-10-CM | POA: Diagnosis not present

## 2020-08-18 DIAGNOSIS — D2272 Melanocytic nevi of left lower limb, including hip: Secondary | ICD-10-CM

## 2020-08-18 DIAGNOSIS — L219 Seborrheic dermatitis, unspecified: Secondary | ICD-10-CM

## 2020-08-18 MED ORDER — CLOBETASOL PROPIONATE 0.05 % EX SOLN: 1.0000 "application " | Freq: Two times a day (BID) | CUTANEOUS | 0 refills | Status: DC

## 2020-08-18 NOTE — Patient Instructions (Addendum)
Notalgia Paresthetica  Over the counter ceraVe itch relief pramoxine = for itch on the upper back   Scalp itch flake Scalpicin Maximum Strength Liquid Scalp

## 2020-08-25 ENCOUNTER — Other Ambulatory Visit: Payer: Self-pay

## 2020-08-25 ENCOUNTER — Other Ambulatory Visit: Payer: 59 | Admitting: *Deleted

## 2020-08-25 DIAGNOSIS — I200 Unstable angina: Secondary | ICD-10-CM

## 2020-08-25 DIAGNOSIS — E785 Hyperlipidemia, unspecified: Secondary | ICD-10-CM

## 2020-08-25 LAB — LIPID PANEL
Chol/HDL Ratio: 2.7 ratio (ref 0.0–5.0)
Cholesterol, Total: 157 mg/dL (ref 100–199)
HDL: 58 mg/dL (ref >39)
LDL Chol Calc (NIH): 77 mg/dL (ref 0–99)
Triglycerides: 128 mg/dL (ref 0–149)
VLDL Cholesterol Cal: 22 mg/dL (ref 5–40)

## 2020-08-25 LAB — HEPATIC FUNCTION PANEL
ALT: 20 IU/L (ref 0–44)
AST: 17 IU/L (ref 0–40)
Albumin: 4.5 g/dL (ref 3.7–4.7)
Alkaline Phosphatase: 48 IU/L (ref 44–121)
Bilirubin Total: 0.7 mg/dL (ref 0.0–1.2)
Bilirubin, Direct: 0.22 mg/dL (ref 0.00–0.40)
Total Protein: 6.8 g/dL (ref 6.0–8.5)

## 2020-08-25 LAB — BASIC METABOLIC PANEL
BUN/Creatinine Ratio: 16 (ref 10–24)
BUN: 16 mg/dL (ref 8–27)
CO2: 24 mmol/L (ref 20–29)
Calcium: 9.3 mg/dL (ref 8.6–10.2)
Chloride: 103 mmol/L (ref 96–106)
Creatinine, Ser: 0.97 mg/dL (ref 0.76–1.27)
Glucose: 90 mg/dL (ref 65–99)
Potassium: 4.1 mmol/L (ref 3.5–5.2)
Sodium: 141 mmol/L (ref 134–144)
eGFR: 82 mL/min/{1.73_m2} (ref >59)

## 2020-08-26 ENCOUNTER — Encounter: Payer: Self-pay | Admitting: Dermatology

## 2020-08-26 NOTE — Progress Notes (Signed)
   New Patient   Subjective  Brett Ross is a 75 y.o. male who presents for the following: Other (New patient - would like an all over check today. No history of skin cancer or abnormal moles.).  General skin examination, several places to check Location:  Duration:  Quality:  Associated Signs/Symptoms: Modifying Factors:  Severity:  Timing: Context:    The following portions of the chart were reviewed this encounter and updated as appropriate:  Tobacco  Allergies  Meds  Problems  Med Hx  Surg Hx  Fam Hx      Objective  Well appearing patient in no apparent distress; mood and affect are within normal limits. Objective  Left Upper Back: Previous cyst scar per patient  Objective  Left Upper Back: Itch without visible rash or inflamed growth compatible with neurogenic pruritus.   Objective  Left Anterior Mandible: Stable tan l textured 75mm lesion, also right lower abdomen large seb k: Brown 1.4cm, dermoscopy typical  Objective  Mid Frontal Scalp: Moderately nflammatory scale, may be a mixture of seborrheic dermatitis plus minor ultraviolet damage.  Objective  Left Middle Plantar Surface: 60mm monochrome brown macule, dermoscopy supports benign acral lentigo.  Objective  Mid Back: Total-body skin check, currently no atypical moles or nonmelanoma skin cancer.    A full examination was performed including scalp, head, eyes, ears, nose, lips, neck, chest, axillae, abdomen, back, buttocks, bilateral upper extremities, bilateral lower extremities, hands, feet, fingers, toes, fingernails, and toenails. All findings within normal limits unless otherwise noted below.   Assessment & Plan  Scar conditions and fibrosis of skin Left Upper Back  Notalgia paresthetica Left Upper Back  We will look for any over-the-counter anti-itch lotion containing the ingredient pramoxine; sample CeraVe itch relief provided  Seborrheic keratosis Left Anterior  Mandible  Intervention not currently necessary.  Seborrheic dermatitis Mid Frontal Scalp  Scalpicin Maximum Strength Liquid Scalp over the counter If the over-the-counter fails he may get a prescription for either halobetasol/clobetasol foam solution or lotion.  This will be applied daily to the scaly area for 2 to 3weeks.  Avoid use on face and body folds.  clobetasol (TEMOVATE) 0.05 % external solution - Mid Frontal Scalp  Nevus Left Middle Plantar Surface  Leave if stable.  Encounter for screening for malignant neoplasm of skin Mid Back  Annual skin examination, encouraged to self examine twice annually.  Continued ultraviolet protection.

## 2020-10-09 ENCOUNTER — Ambulatory Visit (INDEPENDENT_AMBULATORY_CARE_PROVIDER_SITE_OTHER): Payer: 59 | Admitting: Ophthalmology

## 2020-10-09 ENCOUNTER — Encounter (INDEPENDENT_AMBULATORY_CARE_PROVIDER_SITE_OTHER): Payer: Self-pay | Admitting: Ophthalmology

## 2020-10-09 ENCOUNTER — Other Ambulatory Visit: Payer: Self-pay

## 2020-10-09 DIAGNOSIS — H35343 Macular cyst, hole, or pseudohole, bilateral: Secondary | ICD-10-CM | POA: Diagnosis not present

## 2020-10-09 DIAGNOSIS — Z9889 Other specified postprocedural states: Secondary | ICD-10-CM

## 2020-10-09 NOTE — Assessment & Plan Note (Signed)
History of of macular hole and repair OU.  Each eye now stable with postoperative post macular hole repair type changes yet with good acuity

## 2020-10-09 NOTE — Progress Notes (Signed)
10/09/2020     CHIEF COMPLAINT Patient presents for Retina Follow Up (1 Year F/U OU//Pt denies changes to [place]. Pt reports intermittent floaters OU. No flashes or ocular pain OU.)   HISTORY OF PRESENT ILLNESS: Brett Ross is a 75 y.o. male who presents to the clinic today for:   HPI     Retina Follow Up           Diagnosis: Other   Laterality: both eyes   Onset: 1 year ago   Severity: mild   Duration: 1 year   Course: stable   Comments: 1 Year F/U OU  Pt denies changes to [place]. Pt reports intermittent floaters OU. No flashes or ocular pain OU.       Last edited by Rockie Neighbours, [place] on 10/09/2020  3:15 PM.      Referring physician: Marin Olp, [place],  [place] [address]  HISTORICAL INFORMATION:   Selected notes from the [place]: No current outpatient medications on file. (Ophthalmic Drugs)   No current facility-administered medications for this visit. (Ophthalmic Drugs)   Current Outpatient Medications (Other)  Medication Sig   aspirin EC 81 MG tablet Take 1 tablet (81 mg total) by mouth daily. Swallow whole.   Calcium Carbonate-Vit D-Min (CALTRATE 600+D PLUS) 600-400 MG-UNIT per tablet Chew 1 tablet by mouth daily.   clobetasol (TEMOVATE) 0.05 % external solution Apply 1 application topically 2 (two) times daily.   fluticasone (FLONASE) 50 MCG/ACT nasal spray Place 2 sprays into both nostrils daily.   fluticasone (FLOVENT DISKUS) 50 MCG/BLIST diskus inhaler Inhale 1 puff into the lungs as needed.   loratadine (CLARITIN) 10 MG tablet Take 10 mg by mouth daily.   metoprolol succinate (TOPROL-XL) 25 MG 24 hr tablet Take 0.5 tablets (12.5 mg total) by mouth daily.   Multiple Vitamins-Minerals (CENTRUM SILVER PO) Take by mouth daily.   nitroGLYCERIN (NITROSTAT) 0.4 MG SL tablet Place 1 tablet (0.4 mg total) under the tongue every 5 (five) minutes as needed for chest pain.   Omega-3 Fatty  Acids (SALMON OIL-1000) 200 MG CAPS Take by mouth daily.   pantoprazole (PROTONIX) 20 MG tablet Take 20 mg by mouth 2 (two) times daily.   prasugrel (EFFIENT) 10 MG TABS tablet Take 1 tablet (10 mg total) by mouth daily.   rosuvastatin (CRESTOR) 20 MG tablet Take 1 tablet (20 mg total) by mouth daily.   No current facility-administered medications for this visit. (Other)      REVIEW OF SYSTEMS:    ALLERGIES Allergies  Allergen Reactions   Caffeine     REACTION: tinnitis   Sulfites     Per patient he has difficulty sleeping   Adhesive [Tape] Rash    PAST MEDICAL HISTORY Past Medical History:  Diagnosis Date   Arthritis    Detached retina    L 04/2014.    Environmental allergies    HYPERLIPIDEMIA 02/06/2007   Past Surgical History:  Procedure Laterality Date   cataract surgery     01/09/15 left. right 2018.    INGUINAL HERNIA REPAIR     bilateral   KNEE ARTHROSCOPY  2007   left   VITRECTOMY     06/2014 left   VITRECTOMY     10/2017    FAMILY HISTORY Family History  Problem Relation Age of Onset   Melanoma Brother    Heart disease Brother 2  mi, stent   Heart disease Brother 42       MI-required stent   Heart failure Mother        CHF and heart disease; had breast cancer in 59s   Leukemia Father    Colon cancer Neg Hx    Rectal cancer Neg Hx    Stomach cancer Neg Hx     SOCIAL HISTORY Social History   Tobacco Use   Smoking status: Never   Smokeless tobacco: Never  Substance Use Topics   Alcohol use: Yes    Alcohol/week: 7.0 standard drinks    Types: 7 Cans of beer per week   Drug use: No         OPHTHALMIC EXAM:  Base Eye Exam     Visual Acuity (ETDRS)       Right Left   Dist cc 20/20 20/20    Correction: Glasses         Tonometry (Tonopen, 3:19 PM)       Right Left   Pressure 17 17         Pupils       Dark Light Shape React APD   Right 6 5 Round Sluggish None   Left 5 4 Round Sluggish None         Visual  Fields (Counting fingers)       Left Right    Full Full         Extraocular Movement       Right Left    Full Full         Neuro/Psych     Oriented x3: Yes   Mood/Affect: Normal         Dilation     Both eyes: 1.0% Mydriacyl, 2.5% Phenylephrine @ 3:19 PM           Slit Lamp and Fundus Exam     External Exam       Right Left   External Normal Normal         Slit Lamp Exam       Right Left   Lids/Lashes Normal Normal   Conjunctiva/Sclera White and quiet White and quiet   Cornea Clear Clear   Anterior Chamber Deep and quiet Deep and quiet   Iris Round and reactive Round and reactive   Lens Posterior chamber intraocular lens Posterior chamber intraocular lens   Anterior Vitreous Normal Normal         Fundus Exam       Right Left   Posterior Vitreous Vitrectomized Vitrectomized   Disc Normal Normal   C/D Ratio 0.25 0.25   Macula Normal Normal   Vessels Normal Normal   Periphery Normal Normal            IMAGING AND PROCEDURES  Imaging and Procedures for 10/09/20  OCT, Retina - OU - Both Eyes       Right Eye Quality was good. Scan locations included subfoveal. Central Foveal Thickness: 346. Progression has been stable. Findings include abnormal foveal contour.   Left Eye Quality was good. Scan locations included subfoveal. Central Foveal Thickness: 305. Progression has been stable. Findings include abnormal foveal contour.   Notes History of macular hole and repair OU, no active disease in either eye.             ASSESSMENT/PLAN:  Lamellar macular hole of both eyes History of of macular hole and repair OU.  Each eye now stable with postoperative post macular hole repair  type changes yet with good acuity      ICD-10-CM   1. Lamellar macular hole of both eyes  H35.343 OCT, Retina - OU - Both Eyes    2. History of vitrectomy  Z98.890       1.  No interval change, good acuity observe 2.  Patient follow-up here as  necessary or 2 years  3.  Ophthalmic Meds Ordered this visit:  No orders of the defined types were placed in this encounter.      Return in about 2 years (around 10/10/2022) for DILATE OU, OCT.  There are no Patient Instructions on file for this visit.   Explained the diagnoses, plan, and follow up with the patient and they expressed understanding.  Patient expressed understanding of the importance of proper follow up care.   Clent Demark Gorgeous [place] M.D. Diseases & Surgery of the Retina and Vitreous Retina & Diabetic [place] 10/09/20     Abbreviations: M myopia (nearsighted); A astigmatism; H hyperopia (farsighted); P presbyopia; Mrx spectacle prescription;  CTL contact lenses; OD right eye; OS left eye; OU both eyes  XT exotropia; ET esotropia; PEK punctate epithelial keratitis; PEE punctate epithelial erosions; DES dry eye syndrome; MGD meibomian gland dysfunction; ATs artificial tears; PFAT's preservative free artificial tears; [place] nuclear sclerotic cataract; PSC posterior subcapsular cataract; ERM epi-retinal membrane; PVD posterior vitreous detachment; RD retinal detachment; DM diabetes mellitus; DR diabetic retinopathy; NPDR non-proliferative diabetic retinopathy; PDR proliferative diabetic retinopathy; CSME clinically significant macular edema; DME diabetic macular edema; dbh dot blot hemorrhages; CWS cotton wool spot; POAG primary open angle glaucoma; C/D cup-to-disc ratio; HVF humphrey visual field; GVF goldmann visual field; OCT optical coherence tomography; IOP intraocular pressure; BRVO Branch retinal vein occlusion; CRVO central retinal vein occlusion; CRAO central retinal artery occlusion; BRAO branch retinal artery occlusion; RT retinal tear; SB scleral buckle; PPV pars plana vitrectomy; VH Vitreous hemorrhage; PRP panretinal laser photocoagulation; IVK intravitreal kenalog; VMT vitreomacular traction; MH Macular hole;  NVD neovascularization of the disc; NVE neovascularization  elsewhere; AREDS age related eye disease study; ARMD age related macular degeneration; POAG primary open angle glaucoma; EBMD epithelial/anterior basement membrane dystrophy; ACIOL anterior chamber intraocular lens; IOL intraocular lens; PCIOL posterior chamber intraocular lens; Phaco/IOL phacoemulsification with intraocular lens placement; [place] photorefractive keratectomy; LASIK laser assisted in situ keratomileusis; HTN hypertension; DM diabetes mellitus; COPD chronic obstructive pulmonary disease

## 2021-02-10 NOTE — Progress Notes (Signed)
Phone: [phone]   Subjective:  Patient presents today for their annual physical. Chief complaint-noted.   See problem oriented charting- ROS- full  review of systems was completed and negative  except for: tinnitus (unchanged), palpitations, back pain, seasonal allergies  The following were reviewed and entered/updated in epic: Past Medical History:  Diagnosis Date   Arthritis    Detached retina    L 04/2014.    Environmental allergies    HYPERLIPIDEMIA 02/06/2007   Patient Active Problem List   Diagnosis Date Noted   Hyperlipidemia 02/06/2007    Priority: 2.   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 right 2018.    INGUINAL HERNIA REPAIR     bilateral   KNEE ARTHROSCOPY  2007   left   VITRECTOMY     06/2014 left   VITRECTOMY     10/2017    Family History  Problem Relation Age of Onset   Melanoma Brother    Heart disease Brother 79       mi, stent   Heart disease Brother 31       MI-required stent   Heart failure Mother        CHF and heart disease; had breast cancer in 66s   Leukemia Father    Colon cancer Neg Hx    Rectal cancer Neg Hx    Stomach cancer Neg Hx     Medications- reviewed and updated Current Outpatient Medications  Medication Sig Dispense Refill   aspirin EC 81 MG tablet Take 1 tablet (81 mg total) by mouth daily. Swallow whole. 90 tablet 3   Calcium Carbonate-Vit D-Min (CALTRATE 600+D PLUS) 600-400 MG-UNIT per tablet Chew 1 tablet by mouth daily.     loratadine (CLARITIN) 10 MG tablet Take 10 mg by mouth daily.     metoprolol succinate (TOPROL-XL) 25 MG 24 hr tablet Take 0.5 tablets (12.5 mg total) by mouth daily. 45 tablet 3   Multiple Vitamins-Minerals (CENTRUM  SILVER PO) Take by mouth daily.     Omega-3 Fatty Acids (SALMON OIL-1000) 200 MG CAPS Take by mouth daily.     prasugrel (EFFIENT) 10 MG TABS tablet Take 1 tablet (10 mg total) by mouth daily. 90 tablet 3   rosuvastatin (CRESTOR) 20 MG tablet Take 1 tablet (20 mg total) by mouth daily. 90 tablet 3   nitroGLYCERIN (NITROSTAT) 0.4 MG SL tablet Place 1 tablet (0.4 mg total) under the tongue every 5 (five) minutes as needed for chest pain. (Patient not taking: Reported on 02/12/2021) 25 tablet 3   pantoprazole (PROTONIX) 20 MG tablet Take 20 mg by mouth 2 (two) times daily. (Patient not taking: Reported on 02/12/2021)     No current facility-administered medications for this visit.    Allergies-reviewed and updated Allergies  Allergen Reactions   Caffeine     REACTION: tinnitis   Sulfites     Per patient he has difficulty sleeping   Adhesive [Tape] Rash    Social History   Social History Narrative   Married and has a step son.       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       Enjoys family time   Objective  Objective:  BP 124/74   Pulse (!) 57   Temp 97.8 F (36.6 C) (Temporal)   Ht 5\' 2"  (1.575 m)   Wt 160 lb (72.6 kg)   SpO2 96%   BMI 29.26 kg/m  Gen: NAD, resting comfortably HEENT: Mucous membranes are moist. Oropharynx normal Neck: no thyromegaly CV: RRR no murmurs rubs or gallops Lungs: CTAB no crackles, wheeze, rhonchi Abdomen: soft/nontender/nondistended/normal bowel sounds. No rebound or guarding.  Ext: no edema Skin: warm, dry Neuro: grossly normal, moves all extremities, PERRLA    Assessment and Plan  75 y.o. male presenting for annual physical.  Health Maintenance counseling: 1. Anticipatory guidance: Patient counseled regarding regular dental exams -q6 months, eye exams -yearly with Dr.McCuen and retinal specialist Dr. Zadie Rhine- now up to 2 years,   avoiding smoking and second hand smoke, limiting alcohol to 2 beverages per day - usually under 1, no illicit drug use. 2. Risk factor reduction:  Advised patient of need for regular exercise and diet rich and fruits and vegetables to reduce risk of heart attack and stroke. Exercise- caring for wife is very time consuming- and physically demanding- hard to find time outside of this.  Diet- patient wanted to get down to 150-155 goal last year- has done a good job with maintainence.  Wt Readings from Last 3 Encounters:  02/12/21 160 lb (72.6 kg)  05/26/20 160 lb 9.6 oz (72.8 kg)  02/12/20 160 lb 6.4 oz (72.8 kg)  3. Immunizations/screenings/ancillary studies- discussed Bivalent booster-at pharmacy, Flu shot- TODAY, and Tetanus/Tdap-TODAY - otherwise up-to-date. Immunization History  Administered Date(s) Administered   Fluad Quad(high Dose 65+) 01/30/2019, 03/04/2020   H1N1 04/16/2008   Influenza Whole 02/01/2008, 02/04/2009, 02/01/2011   Influenza, High Dose Seasonal PF 01/26/2016, 01/17/2017, 02/16/2018   Influenza,inj,Quad PF,6+ Mos 01/17/2015   Influenza-Unspecified 02/14/2014   PFIZER(Purple Top)SARS-COV-2 Vaccination 06/29/2019, 07/24/2019, 02/16/2020   Pneumococcal Conjugate-13 01/17/2015   Pneumococcal Polysaccharide-23 09/18/2012   Td 10/07/2008   Zoster Recombinat (Shingrix) 12/23/2017, 03/17/2018   Zoster, Live 06/11/2011  4. Prostate cancer screening- Stable PSA trend and opted to discontinue screening. Nocturia once a night with no other symptoms reported Lab Results  Component Value Date   PSA 1.26 01/30/2019   PSA 1.62 12/19/2017   PSA 1.27 12/14/2016   5. Colon cancer screening - last done 03/03/2011 with a 10-year repeat planned - due 03/02/2021-we went ahead and referred today- may be challenging due to caregiving 6. Skin cancer screening-saw Dr. Denna Haggard in April 2022. advised regular sunscreen use. Denies worrisome, changing, or new skin lesions.  7. Smoking associated  screening (lung cancer screening, AAA screen 65-75, UA)- never smoker 8. STD screening - patient opts out as monogamous.   Status of chronic or acute concerns   #Ongoing caregiver-patient works full-time and still manages to care for his wife who needs extensive care . Wife now over 250 pounds and hard to roll her around in hoyer in the house on the carpet.   #CAD #hyperlipidemia S: Medication: rosuvastatin 20 mg daily, Effient 10 mg daily, and Aspirin 81 mg daily - Dr. Cathie Olden planned for  patient to continue aspirin and effient until December 2023-this is due to patient receiving a stent in [place] and right coronary artery.  Plan will be to start clopidogrel after the 1 year. - reports occasional palpitations  - can be more persistent than in the past. No palpitations  at the moment. More discomfort in chest when lays on left side. No exertional symptoms. He is on metoprolol 12.5 mg XR.  -wonders if dietary or stress or poor sleep trigger Lab Results  Component Value Date   CHOL 157 08/25/2020   HDL 58 08/25/2020   LDLCALC 77 08/25/2020   TRIG 128 08/25/2020   CHOLHDL 2.7 08/25/2020   A/P: CAD appears overall stable since stent. Is getting palpitations at night that are lasting longer- he is going to call cardiology to schedule follow up and between now and that visit is going to journal about symptoms. For now continue current meds  # GERD S:Medication: pantoprazole 20 mg twice daily in past. He has been doing pepcid complete at bedtime - reasonable control.  -does get relief if having symptoms either partial or complete with pepcid A/P: occasionally getting reflux despite Pepcid complete at bedtime- he could also add in omeprazole before breakfast if continues to have issues.    #Allergies-takes zyrtec before bed with reasonable control (claritin not as effective). Likes to have flonase as backup for after yardwork in particular  #Low back pain-referred back to Dr. French Ana lin 2020.  Gave script for PT but he did not have time last year. Strain was coming from positioning wife- even using hoyer lift with her weight gain.  Follow up with Dr. French Ana if worsens - over a year since last viist- can follow up if symptoms worsen  #Knee issues at times worse on left- history of having work done on this.   Recommended follow up: Return in about 1 year (around 02/12/2022) for physical or sooner if needed. Future Appointments  Date Time Provider Alex  08/18/2021 11:30 AM Lavonna Monarch, MD [facility] [place]  07/29/2022  3:15 PM Rankin, Clent Demark, MD [facility] None   Lab/Order associations: fasting   ICD-10-CM   1. Preventative health care  Z00.00 CBC with Differential/Platelet    Comprehensive metabolic panel    Lipid panel    Ambulatory referral to Gastroenterology    2. Hyperlipidemia, unspecified hyperlipidemia type  E78.5 CBC with Differential/Platelet    Comprehensive metabolic panel    Lipid panel    3. Coronary artery disease involving native coronary artery of native heart without angina pectoris  I25.10     4. Gastroesophageal reflux disease without esophagitis  K21.9     5. Screen for colon cancer  Z12.11 Ambulatory referral to Gastroenterology    6. High risk medication use  Z79.899      Return precautions advised.  Garret Reddish, MD

## 2021-02-12 ENCOUNTER — Ambulatory Visit (INDEPENDENT_AMBULATORY_CARE_PROVIDER_SITE_OTHER): Payer: 59 | Admitting: Family Medicine

## 2021-02-12 ENCOUNTER — Other Ambulatory Visit: Payer: Self-pay

## 2021-02-12 ENCOUNTER — Encounter: Payer: Self-pay | Admitting: Family Medicine

## 2021-02-12 VITALS — BP 124/74 | HR 57 | Temp 97.8°F | Ht 62.0 in | Wt 160.0 lb

## 2021-02-12 DIAGNOSIS — K219 Gastro-esophageal reflux disease without esophagitis: Secondary | ICD-10-CM

## 2021-02-12 DIAGNOSIS — Z23 Encounter for immunization: Secondary | ICD-10-CM | POA: Diagnosis not present

## 2021-02-12 DIAGNOSIS — R002 Palpitations: Secondary | ICD-10-CM | POA: Diagnosis not present

## 2021-02-12 DIAGNOSIS — Z0000 Encounter for general adult medical examination without abnormal findings: Secondary | ICD-10-CM | POA: Diagnosis not present

## 2021-02-12 DIAGNOSIS — Z1211 Encounter for screening for malignant neoplasm of colon: Secondary | ICD-10-CM

## 2021-02-12 DIAGNOSIS — I2510 Atherosclerotic heart disease of native coronary artery without angina pectoris: Secondary | ICD-10-CM

## 2021-02-12 DIAGNOSIS — E785 Hyperlipidemia, unspecified: Secondary | ICD-10-CM | POA: Diagnosis not present

## 2021-02-12 LAB — LIPID PANEL
Cholesterol: 141 mg/dL (ref 0–200)
HDL: 62.8 mg/dL (ref >39.00)
LDL Cholesterol: 54 mg/dL (ref 0–99)
NonHDL: 78.34
Total CHOL/HDL Ratio: 2
Triglycerides: 123 mg/dL (ref 0.0–149.0)
VLDL: 24.6 mg/dL (ref 0.0–40.0)

## 2021-02-12 LAB — COMPREHENSIVE METABOLIC PANEL
ALT: 19 U/L (ref 0–53)
AST: 20 U/L (ref 0–37)
Albumin: 4.4 g/dL (ref 3.5–5.2)
Alkaline Phosphatase: 38 U/L — ABNORMAL LOW (ref 39–117)
BUN: 11 mg/dL (ref 6–23)
CO2: 29 mEq/L (ref 19–32)
Calcium: 9.8 mg/dL (ref 8.4–10.5)
Chloride: 106 mEq/L (ref 96–112)
Creatinine, Ser: 0.99 mg/dL (ref 0.40–1.50)
GFR: 75.54 mL/min (ref >60.00)
Glucose, Bld: 95 mg/dL (ref 70–99)
Potassium: 4.4 mEq/L (ref 3.5–5.1)
Sodium: 141 mEq/L (ref 135–145)
Total Bilirubin: 0.8 mg/dL (ref 0.2–1.2)
Total Protein: 6.8 g/dL (ref 6.0–8.3)

## 2021-02-12 LAB — CBC WITH DIFFERENTIAL/PLATELET
Basophils Absolute: 0 10*3/uL (ref 0.0–0.1)
Basophils Relative: 0.3 % (ref 0.0–3.0)
Eosinophils Absolute: 0.1 10*3/uL (ref 0.0–0.7)
Eosinophils Relative: 1.3 % (ref 0.0–5.0)
HCT: 43 % (ref 39.0–52.0)
Hemoglobin: 15.1 g/dL (ref 13.0–17.0)
Lymphocytes Relative: 34 % (ref 12.0–46.0)
Lymphs Abs: 2.5 10*3/uL (ref 0.7–4.0)
MCHC: 35.1 g/dL (ref 30.0–36.0)
MCV: 92 fl (ref 78.0–100.0)
Monocytes Absolute: 0.6 10*3/uL (ref 0.1–1.0)
Monocytes Relative: 8.7 % (ref 3.0–12.0)
Neutro Abs: 4.1 10*3/uL (ref 1.4–7.7)
Neutrophils Relative %: 55.7 % (ref 43.0–77.0)
Platelets: 230 10*3/uL (ref 150.0–400.0)
RBC: 4.68 Mil/uL (ref 4.22–5.81)
RDW: 13.3 % (ref 11.5–15.5)
WBC: 7.4 10*3/uL (ref 4.0–10.5)

## 2021-02-12 LAB — TSH: TSH: 1.23 u[IU]/mL (ref 0.35–5.50)

## 2021-02-12 NOTE — Addendum Note (Signed)
Addended by: Linton Ham on: 02/12/2021 10:16 AM   Modules accepted: Orders

## 2021-02-12 NOTE — Patient Instructions (Addendum)
Health Maintenance Due  Topic Date Due   TETANUS/TDAP Done today in office.  10/08/2018   COVID-19 Vaccine (4 - Booster for [employer] series) Patient will get this scheduled at his local pharmacy.  05/10/2020   INFLUENZA VACCINE Done today in office.  12/01/2020   Is getting palpitations at night that are lasting longer- he is going to call cardiology to schedule follow up and between now and that visit is going to journal about symptoms.  occasionally getting reflux despite Pepcid complete at bedtime- he could also add in omeprazole before breakfast if continues to have issues.    Please stop by lab before you go If you have mychart- we will send your results within 3 business days of [place] receiving them.  If you do not have mychart- we will call you about results within 5 business days of [place] receiving them.  *please also note that you will see labs on mychart as soon as they post. I will later go in and write notes on them- will say "notes from Dr. Yong Channel"  Recommended follow up: Return in about 1 year (around 02/12/2022) for physical or sooner if needed. Sooner if needed in regards to reflux

## 2021-02-26 ENCOUNTER — Encounter: Payer: Self-pay | Admitting: Family Medicine

## 2021-03-02 ENCOUNTER — Other Ambulatory Visit: Payer: Self-pay

## 2021-03-02 DIAGNOSIS — R310 Gross hematuria: Secondary | ICD-10-CM

## 2021-03-06 ENCOUNTER — Other Ambulatory Visit: Payer: Self-pay

## 2021-03-06 DIAGNOSIS — R310 Gross hematuria: Secondary | ICD-10-CM

## 2021-03-11 ENCOUNTER — Other Ambulatory Visit (HOSPITAL_BASED_OUTPATIENT_CLINIC_OR_DEPARTMENT_OTHER): Payer: Self-pay

## 2021-03-11 ENCOUNTER — Ambulatory Visit: Payer: 59 | Attending: Internal Medicine

## 2021-03-11 DIAGNOSIS — Z23 Encounter for immunization: Secondary | ICD-10-CM

## 2021-03-11 MED ORDER — PFIZER COVID-19 VAC BIVALENT 30 MCG/0.3ML IM SUSP
INTRAMUSCULAR | 0 refills | Status: DC
  Filled 2021-03-11: qty 0.3, 1d supply, fill #0

## 2021-03-11 NOTE — Progress Notes (Signed)
   Covid-19 Vaccination Clinic  Name:  Brett Ross    MRN: [id number] DOB: 1945/12/08  03/11/2021  Brett Ross was observed post Covid-19 immunization for 15 minutes without incident. He was provided with Vaccine Information Sheet and instruction to access the V-Safe system.   Brett Ross was instructed to call 911 with any severe reactions post vaccine: Difficulty breathing  Swelling of face and throat  A fast heartbeat  A bad rash all over body  Dizziness and weakness   Immunizations Administered     Name Date Dose VIS Date Route   Pfizer Covid-19 Vaccine Bivalent Booster 03/11/2021 11:03 AM 0.3 mL 12/31/2020 Intramuscular   Manufacturer: [place]   Lot: [id number]   [place]: [phone]

## 2021-03-18 ENCOUNTER — Other Ambulatory Visit: Payer: Self-pay | Admitting: Cardiovascular Disease

## 2021-03-25 ENCOUNTER — Other Ambulatory Visit: Payer: Self-pay | Admitting: Cardiovascular Disease

## 2021-03-27 ENCOUNTER — Encounter: Payer: Self-pay | Admitting: Internal Medicine

## 2021-04-17 ENCOUNTER — Other Ambulatory Visit: Payer: Self-pay | Admitting: Cardiovascular Disease

## 2021-05-10 ENCOUNTER — Encounter (INDEPENDENT_AMBULATORY_CARE_PROVIDER_SITE_OTHER): Payer: Self-pay | Admitting: Ophthalmology

## 2021-06-05 ENCOUNTER — Encounter: Payer: Self-pay | Admitting: Internal Medicine

## 2021-06-08 ENCOUNTER — Other Ambulatory Visit: Payer: Self-pay | Admitting: Cardiovascular Disease

## 2021-06-30 ENCOUNTER — Ambulatory Visit (INDEPENDENT_AMBULATORY_CARE_PROVIDER_SITE_OTHER): Payer: 59 | Admitting: Internal Medicine

## 2021-06-30 ENCOUNTER — Ambulatory Visit: Payer: 59 | Admitting: Internal Medicine

## 2021-06-30 ENCOUNTER — Encounter: Payer: Self-pay | Admitting: Internal Medicine

## 2021-06-30 VITALS — BP 120/60 | HR 68 | Ht 66.0 in | Wt 160.2 lb

## 2021-06-30 DIAGNOSIS — K219 Gastro-esophageal reflux disease without esophagitis: Secondary | ICD-10-CM | POA: Diagnosis not present

## 2021-06-30 DIAGNOSIS — R0789 Other chest pain: Secondary | ICD-10-CM | POA: Diagnosis not present

## 2021-06-30 DIAGNOSIS — Z1211 Encounter for screening for malignant neoplasm of colon: Secondary | ICD-10-CM

## 2021-06-30 MED ORDER — SUTAB 1479-225-188 MG PO TABS: 1.0000 | ORAL_TABLET | Freq: Once | ORAL | 0 refills | Status: AC

## 2021-06-30 NOTE — Patient Instructions (Signed)
If you are age 74 or older, your body mass index should be between 23-30. Your Body mass index is 25.86 kg/m. If this is out of the aforementioned range listed, please consider follow up with your Primary Care Provider.  If you are age 74 or younger, your body mass index should be between 19-25. Your Body mass index is 25.86 kg/m. If this is out of the aformentioned range listed, please consider follow up with your Primary Care Provider.   ________________________________________________________  The [facility] providers would like to encourage you to use [facility] to communicate with providers for non-urgent requests or questions.  Due to long hold times on the telephone, sending your provider a message by [facility] may be a faster and more efficient way to get a response.  Please allow 48 business hours for a response.  Please remember that this is for non-urgent requests.  _______________________________________________________  Brett Ross have been scheduled for an endoscopy and colonoscopy. Please follow written instructions given to you at your visit today. If you use inhalers (even only as needed), please bring them with you on the day of your procedure.  Continue to take over the counter Omeprazole daily

## 2021-06-30 NOTE — Progress Notes (Signed)
HISTORY OF PRESENT ILLNESS:  Brett Ross is a 76 y.o. 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  I last saw the patient on October 2012 for screening colonoscopy.  Index examination 2006 with hyperplastic polyps.  Examination 2012 revealing sigmoid diverticulosis and internal hemorrhoids.  Otherwise normal.  Follow-up in 10 years recommended.  The patient denies any lower GI complaints.  No family history of colon cancer.  He is interested in colonoscopy as his means of follow-up screening.  Patient tells me that he has had chronic reflux symptoms for some time.  Over the past 9 months he has been using omeprazole intermittently.  This helps.  He will develop recurrent symptoms off medication.  No dysphagia.  He has had 10 pound weight loss by intent.  He did have an ER evaluation December 2022 in [place] for chest discomfort.  Cardiac work-up was negative.  They suggested it may be GERD.  He has had no recurrent problems of that sort since.  Review of outside blood work from October 2022 shows unremarkable comprehensive metabolic panel.  Unremarkable CBC with hemoglobin 15.1.  Previous CT scan of the abdomen and pelvis with contrast July 2017 to evaluate right lower quadrant pain revealed no acute abnormalities.  Incidental diverticulosis noted.  REVIEW OF SYSTEMS:  All non-GI ROS negative unless otherwise stated in the HPI except for back pain, hematuria (evaluated by urology, Dr. Gloriann Loan)  Past Medical History:  Diagnosis Date   Arthritis    CAD (coronary artery disease) 2021   Detached retina    L 04/2014.    Environmental allergies    HYPERLIPIDEMIA 02/06/2007    Past Surgical History:  Procedure Laterality Date   cataract surgery     01/09/15 left. right 2018.    CORONARY ANGIOPLASTY WITH STENT PLACEMENT  04/2020   INGUINAL  HERNIA REPAIR     bilateral   KNEE ARTHROSCOPY  2007   left   VITRECTOMY     06/2014 left   VITRECTOMY     10/2017    Social History [name]  reports that he has never smoked. He has never used smokeless tobacco. He reports current alcohol use of about 7.0 standard drinks per week. He reports that he does not use drugs.  family history includes Breast cancer in his mother; Heart disease (age of onset: 63) in his brother; Heart disease (age of onset: 2) in his brother; Heart failure in his mother; Leukemia in his father; Melanoma in his brother.  Allergies  Allergen Reactions   Caffeine     REACTION: tinnitis   Sulfites     Per patient he has difficulty sleeping   Adhesive [Tape] Rash       PHYSICAL EXAMINATION: Vital signs: BP 120/60    Pulse 68    Ht 5\' 6"  (1.676 m)    Wt 160 lb 3.2 oz (72.7 kg)    SpO2 97%    BMI 25.86 kg/m   Constitutional: generally well-appearing, no acute distress Psychiatric: alert and oriented x3, cooperative Eyes: extraocular movements intact, anicteric, conjunctiva pink Mouth: oral pharynx moist, no lesions Neck: supple no lymphadenopathy Cardiovascular: heart regular rate and rhythm, no murmur Lungs: clear to auscultation bilaterally Abdomen: soft, nontender, nondistended, no obvious ascites, no peritoneal signs, normal bowel sounds, no organomegaly Rectal: Deferred to colonoscopy Extremities: no clubbing, cyanosis, or lower extremity edema bilaterally  Skin: no lesions on visible extremities Neuro: No focal deficits.  Cranial nerves intact  ASSESSMENT:  1.  Chronic GERD.  Requires PPI to control symptoms.  He has been using sporadically.  Has questions regarding long-term side effects. 2.  Recent problems with chest pain without recurrence.  Negative evaluation in [place] 3.  Screening colonoscopy.  Negative examinations 2006, 2012.  Due for follow-up.  Motivated   PLAN:  1.  Reflux precautions 2.  Take omeprazole 20 mg daily.   Medication risks reviewed 3.  Schedule upper endoscopy to rule out Barrett's esophagus.  Also evaluate for other entities that may have caused his transient chest pain.The nature of the procedure, as well as the risks, benefits, and alternatives were carefully and thoroughly reviewed with the patient. Ample time for discussion and questions allowed. The patient understood, was satisfied, and agreed to proceed.  4.  Schedule screening colonoscopy.The nature of the procedure, as well as the risks, benefits, and alternatives were carefully and thoroughly reviewed with the patient. Ample time for discussion and questions allowed. The patient understood, was satisfied, and agreed to proceed.

## 2021-07-23 ENCOUNTER — Other Ambulatory Visit: Payer: Self-pay | Admitting: Cardiovascular Disease

## 2021-08-17 ENCOUNTER — Ambulatory Visit: Payer: 59 | Admitting: Dermatology

## 2021-08-18 ENCOUNTER — Ambulatory Visit (INDEPENDENT_AMBULATORY_CARE_PROVIDER_SITE_OTHER): Payer: 59 | Admitting: Dermatology

## 2021-08-18 ENCOUNTER — Encounter: Payer: Self-pay | Admitting: Dermatology

## 2021-08-18 DIAGNOSIS — L821 Other seborrheic keratosis: Secondary | ICD-10-CM

## 2021-08-18 DIAGNOSIS — Z1283 Encounter for screening for malignant neoplasm of skin: Secondary | ICD-10-CM

## 2021-08-18 DIAGNOSIS — D2272 Melanocytic nevi of left lower limb, including hip: Secondary | ICD-10-CM | POA: Diagnosis not present

## 2021-08-18 DIAGNOSIS — L918 Other hypertrophic disorders of the skin: Secondary | ICD-10-CM | POA: Diagnosis not present

## 2021-08-27 ENCOUNTER — Encounter: Payer: Self-pay | Admitting: Cardiovascular Disease

## 2021-08-27 ENCOUNTER — Ambulatory Visit (AMBULATORY_SURGERY_CENTER): Payer: 59 | Admitting: Internal Medicine

## 2021-08-27 ENCOUNTER — Encounter: Payer: Self-pay | Admitting: Internal Medicine

## 2021-08-27 VITALS — BP 136/67 | HR 56 | Temp 98.2°F | Resp 16 | Ht 66.0 in | Wt 160.0 lb

## 2021-08-27 DIAGNOSIS — K219 Gastro-esophageal reflux disease without esophagitis: Secondary | ICD-10-CM

## 2021-08-27 DIAGNOSIS — K222 Esophageal obstruction: Secondary | ICD-10-CM

## 2021-08-27 DIAGNOSIS — Z1211 Encounter for screening for malignant neoplasm of colon: Secondary | ICD-10-CM

## 2021-08-27 DIAGNOSIS — Z86010 Personal history of colonic polyps: Secondary | ICD-10-CM | POA: Diagnosis not present

## 2021-08-27 DIAGNOSIS — D122 Benign neoplasm of ascending colon: Secondary | ICD-10-CM

## 2021-08-27 MED ORDER — SODIUM CHLORIDE 0.9 % IV SOLN: 500.0000 mL | Freq: Once | INTRAVENOUS | Status: DC

## 2021-08-27 NOTE — Op Note (Signed)
[facility] ?Patient Name: Brett Ross ?Procedure Date: 08/27/2021 8:54 AM ?MRN: [id number] ?Endoscopist: Docia Chuck. Henrene Pastor , MD ?Age: 76 ?Referring MD:  ?Date of Birth: 1946-02-27 ?Gender: Male ?Account #: [id number] ?Procedure:                Upper GI endoscopy ?Indications:              Esophageal reflux, Screening for Barrett's esophagus ?Medicines:                Monitored Anesthesia Care ?Procedure:                Pre-Anesthesia Assessment: ?                          - Prior to the procedure, a History and Physical  ?                          was performed, and patient medications and  ?                          allergies were reviewed. The patient's tolerance of  ?                          previous anesthesia was also reviewed. The risks  ?                          and benefits of the procedure and the sedation  ?                          options and risks were discussed with the patient.  ?                          All questions were answered, and informed consent  ?                          was obtained. Prior Anticoagulants: The patient has  ?                          taken no previous anticoagulant or antiplatelet  ?                          agents. ASA Grade Assessment: II - A patient with  ?                          mild systemic disease. After reviewing the risks  ?                          and benefits, the patient was deemed in  ?                          satisfactory condition to undergo the procedure. ?                          After obtaining informed consent, the endoscope was  ?  passed under direct vision. Throughout the  ?                          procedure, the patient's blood pressure, pulse, and  ?                          oxygen saturations were monitored continuously. The  ?                          Endoscope was introduced through the mouth, and  ?                          advanced to the second part of duodenum. The upper  ?                          GI  endoscopy was accomplished without difficulty.  ?                          The patient tolerated the procedure well. ?Scope In: ?Scope Out: ?Findings:                 The esophagus revealed large caliber distal  ?                          esophageal stricture at the gastroesophageal  ?                          junction (35 cm). NO Barrett's. ?                          The stomach revealed a sliding hiatal hernia and a  ?                          few diminutive benign fundic gland type polyps. The  ?                          stomach was otherwise normal. ?                          The examined duodenum was normal. ?                          The cardia and gastric fundus were normal on  ?                          retroflexion. ?Complications:            No immediate complications. ?Estimated Blood Loss:     Estimated blood loss: none. ?Impression:               1. Incidental distal esophageal stricture ?                          2. Hiatal hernia and benign fundic gland polyps ?  3. Otherwise normal EGD ?                          4. GERD without Barrett's esophagus. ?Recommendation:           - Patient has a contact number available for  ?                          emergencies. The signs and symptoms of potential  ?                          delayed complications were discussed with the  ?                          patient. Return to normal activities tomorrow.  ?                          Written discharge instructions were provided to the  ?                          patient. ?                          - Resume previous diet. ?                          - Continue present medications. ?Docia Chuck. Henrene Pastor, MD ?08/27/2021 9:29:31 AM ?This report has been signed electronically. ?

## 2021-08-27 NOTE — Progress Notes (Signed)
Called to room to assist during endoscopic procedure.  Patient ID and intended procedure confirmed with present staff. Received instructions for my participation in the procedure from the performing physician.  

## 2021-08-27 NOTE — Progress Notes (Signed)
HISTORY OF PRESENT ILLNESS: ?  ?Brett Ross is a 76 y.o. 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  I last saw the patient on October 2012 for screening colonoscopy.  Index examination 2006 with hyperplastic polyps.  Examination 2012 revealing sigmoid diverticulosis and internal hemorrhoids.  Otherwise normal.  Follow-up in 10 years recommended.  The patient denies any lower GI complaints.  No family history of colon cancer.  He is interested in colonoscopy as his means of follow-up screening.  Patient tells me that he has had chronic reflux symptoms for some time.  Over the past 9 months he has been using omeprazole intermittently.  This helps.  He will develop recurrent symptoms off medication.  No dysphagia.  He has had 10 pound weight loss by intent.  He did have an ER evaluation December 2022 in [place] for chest discomfort.  Cardiac work-up was negative.  They suggested it may be GERD.  He has had no recurrent problems of that sort since.  Review of outside blood work from October 2022 shows unremarkable comprehensive metabolic panel.  Unremarkable CBC with hemoglobin 15.1.  Previous CT scan of the abdomen and pelvis with contrast July 2017 to evaluate right lower quadrant pain revealed no acute abnormalities.  Incidental diverticulosis noted. ?  ?REVIEW OF SYSTEMS: ?  ?All non-GI ROS negative unless otherwise stated in the HPI except for back pain, hematuria (evaluated by urology, Brett Ross) ?  ?    ?Past Medical History:  ?Diagnosis Date  ? Arthritis    ? CAD (coronary artery disease) 2021  ? Detached retina    ?  L 04/2014.   ? Environmental allergies    ? HYPERLIPIDEMIA 02/06/2007  ?  ?  ?     ?Past Surgical History:  ?Procedure Laterality Date  ? cataract surgery      ?  01/09/15 left. right 2018.   ? CORONARY ANGIOPLASTY WITH STENT PLACEMENT    04/2020  ? INGUINAL HERNIA REPAIR      ?  bilateral  ? KNEE ARTHROSCOPY   2007  ?  left  ? VITRECTOMY      ?  06/2014 left  ? VITRECTOMY      ?  10/2017  ?  ?  ?Social History ?Brett Ross  reports that he has never smoked. He has never used smokeless tobacco. He reports current alcohol use of about 7.0 standard drinks per week. He reports that he does not use drugs. ?  ?family history includes Breast cancer in his mother; Heart disease (age of onset: 1) in his brother; Heart disease (age of onset: 75) in his brother; Heart failure in his mother; Leukemia in his father; Melanoma in his brother. ?  ?     ?Allergies  ?Allergen Reactions  ? Caffeine    ?    REACTION: tinnitis  ? Sulfites    ?    Per patient he has difficulty sleeping  ? Adhesive [Tape] Rash  ?  ?  ?  ?  ?PHYSICAL EXAMINATION: ?Vital signs: BP 120/60   Pulse 68   Ht '5\' 6"'$  (1.676 m)   Wt 160 lb 3.2 oz (72.7 kg)   SpO2 97%   BMI 25.86 kg/m?   ?Constitutional: generally well-appearing, no acute distress ?Psychiatric: alert and oriented x3, cooperative ?Eyes: extraocular movements intact, anicteric, conjunctiva pink ?Mouth: oral pharynx moist,  no lesions ?Neck: supple no lymphadenopathy ?Cardiovascular: heart regular rate and rhythm, no murmur ?Lungs: clear to auscultation bilaterally ?Abdomen: soft, nontender, nondistended, no obvious ascites, no peritoneal signs, normal bowel sounds, no organomegaly ?Rectal: Deferred to colonoscopy ?Extremities: no clubbing, cyanosis, or lower extremity edema bilaterally ?Skin: no lesions on visible extremities ?Neuro: No focal deficits.  Cranial nerves intact ?  ?ASSESSMENT: ?  ?1.  Chronic GERD.  Requires PPI to control symptoms.  He has been using sporadically.  Has questions regarding long-term side effects. ?2.  Recent problems with chest pain without recurrence.  Negative evaluation in [place] ?3.  Screening colonoscopy.  Negative examinations 2006, 2012.  Due for follow-up.  Motivated ?  ?  ?PLAN: ?   ?1.  Reflux precautions ?2.  Take omeprazole 20 mg daily.  Medication risks reviewed ?3.  Schedule upper endoscopy to rule out Barrett's esophagus.  Also evaluate for other entities that may have caused his transient chest pain.The nature of the procedure, as well as the risks, benefits, and alternatives were carefully and thoroughly reviewed with the patient. Ample time for discussion and questions allowed. The patient understood, was satisfied, and agreed to proceed.  ?4.  Schedule screening colonoscopy.The nature of the procedure, as well as the risks, benefits, and alternatives were carefully and thoroughly reviewed with the patient. Ample time for discussion and questions allowed. The patient understood, was satisfied, and agreed to proceed.  ? ?Patient was seen in the office June 30, 2021 regarding screening colonoscopy and chronic GERD.  See the above full H&P.  No interval change in either the clinical history or physical exam. ?

## 2021-08-27 NOTE — Patient Instructions (Signed)
Discharge instructions given. ?Handouts on polyps,diverticulosis,hemorrhoids and Hiatal Hernia. ?Resume previous medications. ?YOU HAD AN ENDOSCOPIC PROCEDURE TODAY AT [facility]:   Refer to the procedure report that was given to you for any specific questions about what was found during the examination.  If the procedure report does not answer your questions, please call your gastroenterologist to clarify.  If you requested that your care partner not be given the details of your procedure findings, then the procedure report has been included in a sealed envelope for you to review at your convenience later. ? ?YOU SHOULD EXPECT: Some feelings of bloating in the abdomen. Passage of more gas than usual.  Walking can help get rid of the air that was put into your GI tract during the procedure and reduce the bloating. If you had a lower endoscopy (such as a colonoscopy or flexible sigmoidoscopy) you may notice spotting of blood in your stool or on the toilet paper. If you underwent a bowel prep for your procedure, you may not have a normal bowel movement for a few days. ? ?Please Note:  You might notice some irritation and congestion in your nose or some drainage.  This is from the oxygen used during your procedure.  There is no need for concern and it should clear up in a day or so. ? ?SYMPTOMS TO REPORT IMMEDIATELY: ? ?Following lower endoscopy (colonoscopy or flexible sigmoidoscopy): ? Excessive amounts of blood in the stool ? Significant tenderness or worsening of abdominal pains ? Swelling of the abdomen that is new, acute ? Fever of 100?F or higher ? ?Following upper endoscopy (EGD) ? Vomiting of blood or coffee ground material ? New chest pain or pain under the shoulder blades ? Painful or persistently difficult swallowing ? New shortness of breath ? Fever of 100?F or higher ? Black, tarry-looking stools ? ?For urgent or emergent issues, a gastroenterologist can be reached at any hour by calling  [phone]. ?Do not use MyChart messaging for urgent concerns.  ? ? ?DIET:  We do recommend a small meal at first, but then you may proceed to your regular diet.  Drink plenty of fluids but you should avoid alcoholic beverages for 24 hours. ? ?ACTIVITY:  You should plan to take it easy for the rest of today and you should NOT DRIVE or use heavy machinery until tomorrow (because of the sedation medicines used during the test).   ? ?FOLLOW UP: ?Our staff will call the number listed on your records 48-72 hours following your procedure to check on you and address any questions or concerns that you may have regarding the information given to you following your procedure. If we do not reach you, we will leave a message.  We will attempt to reach you two times.  During this call, we will ask if you have developed any symptoms of COVID 19. If you develop any symptoms (ie: fever, flu-like symptoms, shortness of breath, cough etc.) before then, please call [phone].  If you test positive for Covid 19 in the 2 weeks post procedure, please call and report this information to [place].   ? ?If any biopsies were taken you will be contacted by phone or by letter within the next 1-3 weeks.  Please call us at [phone] if you have not heard about the biopsies in 3 weeks.  ? ? ?SIGNATURES/CONFIDENTIALITY: ?You and/or your care partner have signed paperwork which will be entered into your electronic medical record.  These signatures  attest to the fact that that the information above on your After Visit Summary has been reviewed and is understood.  Full responsibility of the confidentiality of this discharge information lies with you and/or your care-partner.   ?

## 2021-08-27 NOTE — Progress Notes (Signed)
?Cardiology Office Note:   ? ?Date:  08/28/2021  ? ?ID:  Brett Ross, DOB 1945/05/09, MRN [id number] ? ?PCP:  Brett Olp, MD  ?[facility] Cardiologist:  Brett Ross  ?[place] Electrophysiologist:  None  ? ?Referring MD: Brett Olp, MD  ? ?Chief Complaint  ?Patient presents with  ? Hyperlipidemia  ? ? ?previous notes.   ? ?Brett Ross is a 76 y.o. male with a hx of chest pain , hyperlipidemia.   We were asked to see him today by Brett Flaming, MD for further evaluation of his chest pain .  ? ?prevously saw Brett Ross ? ?For past several months, has been waking up with arm tingling .  ?Saw his primary MD  ? ?Several weeks ago , he was pushing his wife in wheelchair , 100 + degree day .   ?Developed chest pain when he was through. ?Lasted 20 min or so.  + diaphoresis,    ?Has not felt well since that ?Went to primary MD ?ECG on Aug. 20, 2021 showed NSR ,  No ST or T wave changes ?No regular exercise except for mowing his yard.  ? ? ?Brother ( smoker) had extensive CAD  ?2nd brother also has significant CAD  ?Mother had cad ?Father died of leukemia at age 76.  ? ?Labs from 8/20 were reviewed  - trigs are 228. ? ?Sept. 24, 2021  ? ?Brett Ross is seen today for follow up of his chest pain / UAP  ?Coronary CT angiogram revealed a Coronary calcium score of 476. This was 69th percentile for age ?and sex matched control ?He has moderate disease in his LAD ?The LCx has mild plaque ?RCA has moderate disease.  ?FFR revealed significant disease in the RCA.   He has been asymptomatic . ?Our plan was to continue medical therapy for now but have a low threshold to refer for cath if he has any additional CP ? ?We tried Imdur but he had to stop it due to side effects. ? ?He continues to have more chest pain , not quite as severe but more frequent  ?More chest pain if he lies on his left side.  ? ?Jan. 24, 2022 ?Brett Ross is seen today for follow up of his CAD ? ?Coronary CT angiogram revealed a Coronary  calcium score of 476. This was 69th percentile for age ?and sex matched control ?He has moderate disease in his LAD ?The LCx has mild plaque ?RCA has moderate disease.  ?FFR revealed significant disease in the RCA.   He has been asymptomatic. ?He saw a cardiologist in [employer] and his a stent placed in his RCA  ( Dec. 22, 2021)  ?Is on Effient and ASA ?He was not having any additional symptoms  ?Labs from last week were reviewed.  His total cholesterol is 165.  HDL is 60.  Triglycerides are 107.  LDL is 86. ? ?August 28, 2021 ?Brett Ross is seen for follow up of his CAD .  ?Is back doing yard work .  ? ?He typically goes to [place] for Christmas and through the holidays.  He had some chest pain while there.  Work-up in the emergency room was unremarkable.  He had a stress test which was unremarkable.  He has not had any further episodes since then. ? ?Past Medical History:  ?Diagnosis Date  ? Allergy   ? Arthritis   ? CAD (coronary artery disease) 2021  ? Cataract   ? bilateral cateracts removed  with implants bilateraly  ? Detached retina   ? L 04/2014  Right 1990  ? Environmental allergies   ? GERD (gastroesophageal reflux disease)   ? HYPERLIPIDEMIA 02/06/2007  ? 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 ? ?Past Surgical History:  ?Procedure Laterality Date  ? cataract surgery    ? 01/09/15 left. right 2018.   ? COLONOSCOPY    ? CORONARY ANGIOPLASTY WITH STENT PLACEMENT  04/2020  ? INGUINAL HERNIA REPAIR    ? bilateral  ? KNEE ARTHROSCOPY  2007  ? left  ? POLYPECTOMY    ? VITRECTOMY    ? 06/2014 left  ? VITRECTOMY    ? 10/2017  ? ? ?Current Medications: ?Current Meds  ?Medication Sig  ? aspirin EC 81 MG tablet Take 1 tablet (81 mg total) by mouth daily. Swallow whole.  ? cetirizine (ZYRTEC) 10 MG tablet Take 10 mg by mouth at bedtime.  ? clobetasol (TEMOVATE) 0.05 % external solution clobetasol 0.05 % scalp solution ? APPLY TO AFFECTED AREA TWICE A DAY  ? famotidine (PEPCID) 20 MG tablet Take by mouth at bedtime.  ?  metoprolol succinate (TOPROL-XL) 25 MG 24 hr tablet TAKE ONE-HALF TABLET BY  MOUTH DAILY  ? Multiple Vitamins-Minerals (CENTRUM SILVER PO) Take by mouth daily.  ? Omega-3 Fatty Acids (SALMON OIL-1000) 200 MG CAPS Take by mouth daily.  ? omeprazole (PRILOSEC) 20 MG capsule Take by mouth daily.  ? rosuvastatin (CRESTOR) 20 MG tablet TAKE 1 TABLET BY MOUTH ONCE  DAILY  ?  ? ?Allergies:   Caffeine, Molds & smuts, Other, Sulfites, and Adhesive [tape]  ? ?Social History  ? ?Socioeconomic History  ? Marital status: Married  ?  Spouse name: Not on file  ? Number of children: Not on file  ? Years of education: Not on file  ? Highest education level: Not on file  ?Occupational History  ? Not on file  ?Tobacco Use  ? Smoking status: Never  ? Smokeless tobacco: Never  ?Vaping Use  ? Vaping Use: Never used  ?Substance and Sexual Activity  ? Alcohol use: Yes  ?  Alcohol/week: 7.0 standard drinks  ?  Types: 7 Cans of beer per week  ? Drug use: No  ? Sexual activity: Not on file  ?Other Topics Concern  ? Not on file  ?Social History Narrative  ? Married and has a step son.   ?   ? 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 Works at [employer] now [employer].   ?   ? Enjoys family time  ? ?Social Determinants of Health  ? ?Financial Resource Strain: Not on file  ?Food Insecurity: Not on file  ?Transportation Needs: Not on file  ?Physical Activity: Not on file  ?Stress: Not on file  ?Social Connections: Not on file  ?  ? ?Family History: ?The patient's family history includes Breast cancer in his mother; Heart disease (age of onset: 14) in his brother; Heart disease (age of onset: 76) in his brother; Heart failure in his mother; Leukemia in his father; Melanoma in his brother. There is no history of Colon cancer, Rectal cancer, Stomach cancer, or Esophageal cancer. ? ?ROS:   ?Please see the history of present illness.    ? All other systems reviewed and are  negative. ? ?EKGs/Labs/Other Studies Reviewed:   ? ?The following studies were reviewed today: ? ? ?  EKG:   August 28, 2021:.  NSR at 65. NS ST abn.  ? ? ?Recent Labs: ?02/12/2021: ALT 19; BUN 11; Creatinine, Ser 0.99; Hemoglobin 15.1; Platelets 230.0; Potassium 4.4; Sodium 141; TSH 1.23  ?Recent Lipid Panel ?   ?Component Value Date/Time  ? CHOL 141 02/12/2021 1011  ? CHOL 157 08/25/2020 0802  ? TRIG 123.0 02/12/2021 1011  ? HDL 62.80 02/12/2021 1011  ? HDL 58 08/25/2020 0802  ? CHOLHDL 2 02/12/2021 1011  ? VLDL 24.6 02/12/2021 1011  ? LDLCALC 54 02/12/2021 1011  ? [place] 77 08/25/2020 0802  ? [place] 70 12/21/2019 1356  ? ? ?Physical Exam:   ? ? ?Physical Exam: ?Blood pressure 118/60, pulse 65, height '5\' 6"'$  (1.676 m), weight 159 lb 3.2 oz (72.2 kg), SpO2 94 %. ? ?GEN:  Well nourished, well developed in no acute distress ?HEENT: Normal ?NECK: No JVD; No carotid bruits ?LYMPHATICS: No lymphadenopathy ?CARDIAC: RRR , no murmurs, rubs, gallops ?RESPIRATORY:  Clear to auscultation without rales, wheezing or rhonchi  ?ABDOMEN: Soft, non-tender, non-distended ?MUSCULOSKELETAL:  No edema; No deformity  ?SKIN: Warm and dry ?NEUROLOGIC:  Alert and oriented x 3 ? ? ? ? ?ASSESSMENT:   ? ?1. Hyperlipidemia LDL goal <70   ?2. Coronary artery disease involving native coronary artery of native heart without angina pectoris   ? ? ?PLAN:   ? ?1.  Coronary artery disease: Brett Ross is doing well.  He is not having any episodes of chest pain or shortness of breath.  Lipids look great. ? ? ?2.  Hyperlipidemia:  ?Lipid levels are well controlled.  Continue current medications.  He will check them through Dr. Yong Channel. ? ? ?Medication Adjustments/Labs and Tests Ordered: ?Current medicines are reviewed at length with the patient today.  Concerns regarding medicines are outlined above.  ?Orders Placed This Encounter  ?Procedures  ? EKG 12-Lead  ? ?No orders of the defined types were placed in this encounter. ? ? ?Patient Instructions  ?Medication  Instructions:  ? ?Your physician recommends that you continue on your current medications as directed. Please refer to the Current Medication list given to you today. ? ?*If you need a refill on your cardiac m

## 2021-08-27 NOTE — Op Note (Signed)
[facility] ?Patient Name: Brett Ross ?Procedure Date: 08/27/2021 8:55 AM ?MRN: [id number] ?Endoscopist: Docia Chuck. Henrene Pastor , MD ?Age: 76 ?Referring MD:  ?Date of Birth: 1945-07-14 ?Gender: Male ?Account #: [id number] ?Procedure:                Colonoscopy with cold snare polypectomy x 1 ?Indications:              Screening for colorectal malignant neoplasm.  ?                          Previous examinations 2006 and 2012 were negative  ?                          for neoplasia ?Medicines:                Monitored Anesthesia Care ?Procedure:                Pre-Anesthesia Assessment: ?                          - Prior to the procedure, a History and Physical  ?                          was performed, and patient medications and  ?                          allergies were reviewed. The patient's tolerance of  ?                          previous anesthesia was also reviewed. The risks  ?                          and benefits of the procedure and the sedation  ?                          options and risks were discussed with the patient.  ?                          All questions were answered, and informed consent  ?                          was obtained. Prior Anticoagulants: The patient has  ?                          taken no previous anticoagulant or antiplatelet  ?                          agents. ASA Grade Assessment: II - A patient with  ?                          mild systemic disease. After reviewing the risks  ?                          and benefits, the patient was deemed in  ?  satisfactory condition to undergo the procedure. ?                          After obtaining informed consent, the colonoscope  ?                          was passed under direct vision. Throughout the  ?                          procedure, the patient's blood pressure, pulse, and  ?                          oxygen saturations were monitored continuously. The  ?                          CF [id number] #[id number]  was introduced through the anus  ?                          and advanced to the the cecum, identified by  ?                          appendiceal orifice and ileocecal valve. The  ?                          ileocecal valve, appendiceal orifice, and rectum  ?                          were photographed. The quality of the bowel  ?                          preparation was excellent. The colonoscopy was  ?                          performed without difficulty. The patient tolerated  ?                          the procedure well. The bowel preparation used was  ?                          SUPREP via split dose instruction. ?Scope In: 9:04:27 AM ?Scope Out: 9:17:04 AM ?Scope Withdrawal Time: 0 hours 11 minutes 10 seconds  ?Total Procedure Duration: 0 hours 12 minutes 37 seconds  ?Findings:                 A 8 mm polyp was found in the ascending colon. The  ?                          polyp was pedunculated. The polyp was removed with  ?                          a cold snare. Resection and retrieval were complete. ?                          Many small and large-mouthed diverticula  were found  ?                          in the sigmoid colon. ?                          Internal hemorrhoids were found during  ?                          retroflexion. The hemorrhoids were moderate. ?                          The exam was otherwise without abnormality on  ?                          direct and retroflexion views. ?Complications:            No immediate complications. Estimated blood loss:  ?                          None. ?Estimated Blood Loss:     Estimated blood loss: none. ?Impression:               - One 8 mm polyp in the ascending colon, removed  ?                          with a cold snare. Resected and retrieved. ?                          - Diverticulosis in the sigmoid colon. ?                          - Internal hemorrhoids. ?                          - The examination was otherwise normal on direct  ?                           and retroflexion views. ?Recommendation:           - Repeat colonoscopy in 7 years for surveillance. ?                          - Patient has a contact number available for  ?                          emergencies. The signs and symptoms of potential  ?                          delayed complications were discussed with the  ?                          patient. Return to normal activities tomorrow.  ?                          Written discharge instructions were provided to the  ?  patient. ?                          - Resume previous diet. ?                          - Continue present medications. ?                          - Await pathology results. ?Docia Chuck. Henrene Pastor, MD ?08/27/2021 9:21:21 AM ?This report has been signed electronically. ?

## 2021-08-27 NOTE — Progress Notes (Signed)
Sedate, gd SR, tolerated procedure well, VSS, report to RN 

## 2021-08-28 ENCOUNTER — Encounter: Payer: Self-pay | Admitting: Cardiovascular Disease

## 2021-08-28 ENCOUNTER — Ambulatory Visit (INDEPENDENT_AMBULATORY_CARE_PROVIDER_SITE_OTHER): Payer: 59 | Admitting: Cardiovascular Disease

## 2021-08-28 VITALS — BP 118/60 | HR 65 | Ht 66.0 in | Wt 159.2 lb

## 2021-08-28 DIAGNOSIS — E785 Hyperlipidemia, unspecified: Secondary | ICD-10-CM | POA: Diagnosis not present

## 2021-08-28 DIAGNOSIS — I2510 Atherosclerotic heart disease of native coronary artery without angina pectoris: Secondary | ICD-10-CM | POA: Diagnosis not present

## 2021-08-28 NOTE — Patient Instructions (Signed)
Medication Instructions:  Your physician recommends that you continue on your current medications as directed. Please refer to the Current Medication list given to you today.  *If you need a refill on your cardiac medications before your next appointment, please call your pharmacy*   Follow-Up: At [facility], you and your health needs are our priority.  As part of our continuing mission to provide you with exceptional heart care, we have created designated Provider Care Teams.  These Care Teams include your primary Cardiologist (physician) and Advanced Practice Providers (APPs -  Physician Assistants and Nurse Practitioners) who all work together to provide you with the care you need, when you need it.  We recommend signing up for the patient portal called "MyChart".  Sign up information is provided on this After Visit Summary.  MyChart is used to connect with patients for Virtual Visits (Telemedicine).  Patients are able to view lab/test results, encounter notes, upcoming appointments, etc.  Non-urgent messages can be sent to your provider as well.   To learn more about what you can do with MyChart, go to [email or web address].    Your next appointment:   1 year(s)  The format for your next appointment:   In Person  Provider:   Philip Nahser, MD     Important Information About Sugar       

## 2021-08-31 ENCOUNTER — Telehealth: Payer: Self-pay | Admitting: *Deleted

## 2021-08-31 ENCOUNTER — Encounter: Payer: Self-pay | Admitting: Internal Medicine

## 2021-08-31 ENCOUNTER — Telehealth: Payer: Self-pay

## 2021-08-31 NOTE — Telephone Encounter (Signed)
?  Follow up Call- ? ? ?  08/27/2021  ?  8:15 AM  ?Call back number  ?Post procedure Call Back phone  # [phone] cell  ?Permission to leave phone message Yes  ?  ? ?Patient questions: ? ?Do you have a fever, pain , or abdominal swelling? No. ?Pain Score  0 * ? ?Have you tolerated food without any problems? Yes.   ? ?Have you been able to return to your normal activities? Yes.   ? ?Do you have any questions about your discharge instructions: ?Diet   No. ?Medications  No. ?Follow up visit  No. ? ?Do you have questions or concerns about your Care? No. ? ?Actions: ?* If pain score is 4 or above: ?No action needed, pain <4. ? ? ?

## 2021-08-31 NOTE — Telephone Encounter (Signed)
Left message on follow up call. 

## 2021-09-01 ENCOUNTER — Other Ambulatory Visit: Payer: Self-pay | Admitting: Cardiovascular Disease

## 2021-09-05 ENCOUNTER — Encounter: Payer: Self-pay | Admitting: Dermatology

## 2021-09-05 NOTE — Progress Notes (Signed)
? ?  Follow-Up Visit ?  ?Subjective  ?Brett Ross is a 76 y.o. male who presents for the following: Annual Exam. ? ?Annual skin examination, several spots to check ?Location:  ?Duration:  ?Quality:  ?Associated Signs/Symptoms: ?Modifying Factors:  ?Severity:  ?Timing: ?Context:  ? ?Objective  ?Well appearing patient in no apparent distress; mood and affect are within normal limits. ?No atypical nevi or signs of NMSC noted at the time of the visit. Right axilla and right groin skin tag. right calf and RLQ keratoses, nevus left calf. ? ?Left Abdomen (side) - Lower, Right Lower Leg - Posterior ?Brown textured flattopped 5 mm papules, typical dermoscopy ? ? ? ?A full examination was performed including scalp, head, eyes, ears, nose, lips, neck, chest, axillae, abdomen, back, buttocks, bilateral upper extremities, bilateral lower extremities, hands, feet, fingers, toes, fingernails, and toenails. All findings within normal limits unless otherwise noted below. ? ? ?Assessment & Plan  ? ? ?Screening exam for skin cancer ? ?Annual skin examination ? ?Seborrheic keratosis (2) ?Right Lower Leg - Posterior; Left Abdomen (side) - Lower ? ?Leave if stable ? ? ? ? ? ?I, Lavonna Monarch, MD, have reviewed all documentation for this visit.  The documentation on 09/05/21 for the exam, diagnosis, procedures, and orders are all accurate and complete. ?

## 2022-01-19 ENCOUNTER — Encounter: Payer: Self-pay | Admitting: Family Medicine

## 2022-01-19 ENCOUNTER — Ambulatory Visit (INDEPENDENT_AMBULATORY_CARE_PROVIDER_SITE_OTHER): Payer: 59

## 2022-01-19 DIAGNOSIS — Z23 Encounter for immunization: Secondary | ICD-10-CM | POA: Diagnosis not present

## 2022-01-25 ENCOUNTER — Encounter: Payer: Self-pay | Admitting: Family Medicine

## 2022-02-15 ENCOUNTER — Encounter: Payer: Self-pay | Admitting: Family Medicine

## 2022-02-15 ENCOUNTER — Ambulatory Visit (INDEPENDENT_AMBULATORY_CARE_PROVIDER_SITE_OTHER): Payer: 59 | Admitting: Family Medicine

## 2022-02-15 VITALS — BP 110/68 | HR 68 | Temp 97.7°F | Ht 66.0 in | Wt 160.0 lb

## 2022-02-15 DIAGNOSIS — E785 Hyperlipidemia, unspecified: Secondary | ICD-10-CM | POA: Diagnosis not present

## 2022-02-15 DIAGNOSIS — Z0000 Encounter for general adult medical examination without abnormal findings: Secondary | ICD-10-CM

## 2022-02-15 LAB — CBC WITH DIFFERENTIAL/PLATELET
Basophils Absolute: 0 10*3/uL (ref 0.0–0.1)
Basophils Relative: 0.5 % (ref 0.0–3.0)
Eosinophils Absolute: 0.1 10*3/uL (ref 0.0–0.7)
Eosinophils Relative: 1.1 % (ref 0.0–5.0)
HCT: 44.2 % (ref 39.0–52.0)
Hemoglobin: 15.2 g/dL (ref 13.0–17.0)
Lymphocytes Relative: 29.3 % (ref 12.0–46.0)
Lymphs Abs: 2.4 10*3/uL (ref 0.7–4.0)
MCHC: 34.5 g/dL (ref 30.0–36.0)
MCV: 93.9 fl (ref 78.0–100.0)
Monocytes Absolute: 0.8 10*3/uL (ref 0.1–1.0)
Monocytes Relative: 9.2 % (ref 3.0–12.0)
Neutro Abs: 4.9 10*3/uL (ref 1.4–7.7)
Neutrophils Relative %: 59.9 % (ref 43.0–77.0)
Platelets: 239 10*3/uL (ref 150.0–400.0)
RBC: 4.71 Mil/uL (ref 4.22–5.81)
RDW: 13.5 % (ref 11.5–15.5)
WBC: 8.2 10*3/uL (ref 4.0–10.5)

## 2022-02-15 LAB — URINALYSIS, ROUTINE W REFLEX MICROSCOPIC
Bilirubin Urine: NEGATIVE
Hgb urine dipstick: NEGATIVE
Ketones, ur: NEGATIVE
Nitrite: NEGATIVE
RBC / HPF: NONE SEEN (ref 0–?)
Specific Gravity, Urine: <=1.005 — AB (ref 1.000–1.030)
Total Protein, Urine: NEGATIVE
Urine Glucose: NEGATIVE
Urobilinogen, UA: 0.2 (ref 0.0–1.0)
pH: 6.5 (ref 5.0–8.0)

## 2022-02-15 LAB — COMPREHENSIVE METABOLIC PANEL
ALT: 19 U/L (ref 0–53)
AST: 19 U/L (ref 0–37)
Albumin: 4.7 g/dL (ref 3.5–5.2)
Alkaline Phosphatase: 43 U/L (ref 39–117)
BUN: 14 mg/dL (ref 6–23)
CO2: 29 mEq/L (ref 19–32)
Calcium: 9.3 mg/dL (ref 8.4–10.5)
Chloride: 105 mEq/L (ref 96–112)
Creatinine, Ser: 0.89 mg/dL (ref 0.40–1.50)
GFR: 84.38 mL/min (ref >60.00)
Glucose, Bld: 86 mg/dL (ref 70–99)
Potassium: 4.3 mEq/L (ref 3.5–5.1)
Sodium: 141 mEq/L (ref 135–145)
Total Bilirubin: 0.7 mg/dL (ref 0.2–1.2)
Total Protein: 7.2 g/dL (ref 6.0–8.3)

## 2022-02-15 LAB — LIPID PANEL
Cholesterol: 143 mg/dL (ref 0–200)
HDL: 59.8 mg/dL (ref >39.00)
LDL Cholesterol: 52 mg/dL (ref 0–99)
NonHDL: 82.99
Total CHOL/HDL Ratio: 2
Triglycerides: 157 mg/dL — ABNORMAL HIGH (ref 0.0–149.0)
VLDL: 31.4 mg/dL (ref 0.0–40.0)

## 2022-02-15 MED ORDER — NITROGLYCERIN 0.4 MG SL SUBL: 0.4000 mg | SUBLINGUAL_TABLET | SUBLINGUAL | 3 refills | Status: DC | PRN

## 2022-02-15 MED ORDER — METOPROLOL SUCCINATE ER 25 MG PO TB24: 12.5000 mg | ORAL_TABLET | Freq: Every day | ORAL | 3 refills | Status: DC

## 2022-02-15 NOTE — Patient Instructions (Addendum)
Health Maintenance Due  Topic Date Due   COVID-19 Vaccine (5 - Pfizer risk series) 05/06/2021  - please let us know when you get this OR if you decide to get RSV let me know  Please stop by lab before you go If you have mychart- we will send your results within 3 business days of [place] receiving them.  If you do not have mychart- we will call you about results within 5 business days of [place] receiving them.  *please also note that you will see labs on mychart as soon as they post. I will later go in and write notes on them- will say "notes from Dr. Yong Channel"   Recommended follow up: Return in about 1 year (around 02/16/2023) for physical or sooner if needed.Schedule b4 you leave.

## 2022-02-15 NOTE — Progress Notes (Signed)
Phone: [phone]   Subjective:  Patient presents today for their annual physical. Chief complaint-noted.   See problem oriented charting- ROS- full  review of systems was compbackleted and negative  except for: back pain, seasonal allergies  The following were reviewed and entered/updated in epic: Past Medical History:  Diagnosis Date   Allergy    Arthritis    CAD (coronary artery disease) 2021   Cataract    bilateral cateracts removed with implants bilateraly   Detached retina    L 04/2014  Right 1990   Environmental allergies    GERD (gastroesophageal reflux disease)    HYPERLIPIDEMIA 02/06/2007   Macular pigment epithelial tear    bilateral eyes   Patient Active Problem List   Diagnosis Date Noted   CAD (coronary artery disease) 05/26/2020    Priority: High   Palpitations 11/22/2013    Priority: Medium    Hyperlipidemia 02/06/2007    Priority: Medium    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 right 2018.    COLONOSCOPY     CORONARY ANGIOPLASTY WITH STENT PLACEMENT  04/2020   INGUINAL HERNIA REPAIR     bilateral   KNEE ARTHROSCOPY  2007   left   POLYPECTOMY     VITRECTOMY     06/2014 left   VITRECTOMY     10/2017    Family History  Problem Relation Age of Onset   Heart failure Mother        CHF and heart disease; had breast cancer in 43s   Breast cancer Mother    Leukemia Father    Melanoma Brother    Heart disease Brother 56       mi, stent   Heart disease Brother 66       MI-required stent   Colon cancer Neg Hx    Rectal cancer Neg Hx    Stomach cancer Neg Hx    Esophageal cancer Neg Hx     Medications- reviewed and updated Current Outpatient Medications  Medication Sig Dispense Refill   aspirin EC 81 MG tablet  Take 1 tablet (81 mg total) by mouth daily. Swallow whole. 90 tablet 3   cetirizine (ZYRTEC) 10 MG tablet Take 10 mg by mouth at bedtime.     clobetasol (TEMOVATE) 0.05 % external solution clobetasol 0.05 % scalp solution  APPLY TO AFFECTED AREA TWICE A DAY     famotidine (PEPCID) 20 MG tablet Take by mouth at bedtime.     Multiple Vitamins-Minerals (CENTRUM SILVER PO) Take by mouth daily.     Omega-3 Fatty Acids (SALMON OIL-1000) 200 MG CAPS Take by mouth daily.     rosuvastatin (CRESTOR) 20 MG tablet TAKE 1 TABLET BY MOUTH ONCE  DAILY 90 tablet 3   metoprolol succinate (TOPROL-XL) 25 MG 24 hr tablet Take 0.5 tablets (12.5 mg total) by mouth daily. 45 tablet 3   nitroGLYCERIN (NITROSTAT) 0.4 MG SL tablet Place 1 tablet (0.4 mg total) under the tongue every 5 (five) minutes as needed for chest pain. (Patient not taking: Reported on 02/12/2021) 25 tablet 3   No current facility-administered medications for this visit.    Allergies-reviewed and updated Allergies  Allergen Reactions   Caffeine  REACTION: tinnitis   Molds & Smuts     Pt does not remember reaction   Other     Pateros  Reaction causes sneezing and nasal congerstion   Sulfites     Per patient he has difficulty sleeping   Adhesive [Tape] Rash    Social History   Social History Narrative   Married and has a step son.       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 Oropharynx normal Neck: no thyromegaly CV: RRR no murmurs rubs or gallops Lungs: CTAB no crackles, wheeze, rhonchi Abdomen: soft/nontender/nondistended/normal bowel sounds.  No rebound or guarding.  Ext: no edema Skin: warm, dry Neuro: grossly normal, moves all extremities, PERRLA    Assessment and Plan  76 y.o. male presenting for annual physical.  Health Maintenance counseling: 1. Anticipatory guidance: Patient counseled regarding regular dental exams -q6 months, eye exams-follows with Dr. Ellie Lunch as well as Dr. Zadie Rhine (every 2 years now)yearly,  avoiding smoking and second hand smoke , limiting alcohol to 2 beverages per day -barely 1 a day, no illicit drugs .   2. Risk factor reduction:  Advised patient of need for regular exercise and diet rich and fruits and vegetables to reduce risk of heart attack and stroke.  Exercise- mows own lawn, active as caregiver.  Diet/weight management-weight stable from last year- knows to focus on lowering salt and cooking at home. Poor sleep with higher salt.  Wt Readings from Last 3 Encounters:  02/15/22 160 lb (72.6 kg)  08/28/21 159 lb 3.2 oz (72.2 kg)  08/27/21 160 lb (72.6 kg)  3. Immunizations/screenings/ancillary studies-already had flu shot, discussed new COVID-19 vaccination(he will let us know if he gets these)as well as RSV- may hold off  Immunization History  Administered Date(s) Administered   Fluad Quad(high Dose 65+) 01/30/2019, 03/04/2020, 02/12/2021, 01/19/2022   H1N1 04/16/2008   Influenza Whole 02/01/2008, 02/04/2009, 02/01/2011   Influenza, High Dose Seasonal PF 01/26/2016, 01/17/2017, 02/16/2018   Influenza,inj,Quad PF,6+ Mos 01/17/2015   Influenza-Unspecified 02/14/2014   PFIZER(Purple Top)SARS-COV-2 Vaccination 06/29/2019, 07/24/2019, 02/16/2020   Pfizer Covid-19 Vaccine Bivalent Booster 60yr & up 03/11/2021   Pneumococcal Conjugate-13 01/17/2015   Pneumococcal Polysaccharide-23 09/18/2012   Td 10/07/2008   Tdap 02/12/2021   Zoster Recombinat (Shingrix) 12/23/2017, 03/17/2018   Zoster, Live 06/11/2011  4. Prostate cancer screening- nocturia once a night stable-we stop screening for prostate  cancer around age 89with plan to restart only if symptomatic changes Lab Results  Component Value Date   PSA 1.26 01/30/2019   PSA 1.62 12/19/2017   PSA 1.27 12/14/2016   5. Colon cancer screening - 08/27/21 with 7 year repeat 6. Skin cancer screening-follows with Dr. TDenna Haggard advised regular sunscreen use. Denies worrisome, changing, or new skin lesions.  7. Smoking associated screening (lung cancer screening, AAA screen 65-75, UA)-never smoker  8. STD screening -only active with wife   Status of chronic or acute concerns   #CAD-[place] Dr. [place]  Dr. Elyn Peers 04/18/20 with stent #hyperlipidemia S: Medication:Aspirin 81 mg, metoprolol 12.5 mg extended release daily (also helps with history of palpitations), fish oil, rosuvastatin 20 mg daily - Has nitroglycerin on hand if needed - Symptoms: no chest pain or shortness of breath   A/P: CAD asymptomatic- continue current meds   # GERD S:Medication: pepcid complete controls for most part   A/P: doing well- wants to keep omeprazole as back up   #Arthritis-reports issues in back and right thumb (advised trial voltaren).  Takes Tylenol but not always effective-asks about alternates- knows to avoid nsaids with heart- could use tylenol arthritis    #Low back pain-has followed with Dr. French Ana in the past.  Have referred to physical therapy but has had trouble finding time to get in while caring for wife. -rolling wife on carpet has become more challenging and can stress back  #Change in urination- has noted darker color despite staying well hydrated. No blood in urine  Recommended follow up: Return in about 1 year (around 02/16/2023) for physical or sooner if needed.Schedule b4 you leave. Future Appointments  Date Time Provider [place]  07/29/2022  3:15 PM Rankin, Clent Demark, MD RDE-RDE None   Lab/Order associations: NOT fasting- juice and sweet cereal (was feeling slightly sick on stomach and felt need to  eat)   ICD-10-CM   1. Preventative health care  Z00.00     2. Hyperlipidemia, unspecified hyperlipidemia type  E78.5 CBC with Differential/Platelet    Comprehensive metabolic panel    Lipid panel    Urinalysis, Routine w reflex microscopic     Meds ordered this encounter  Medications   metoprolol succinate (TOPROL-XL) 25 MG 24 hr tablet    Sig: Take 0.5 tablets (12.5 mg total) by mouth daily.    Dispense:  45 tablet    Refill:  3    Requesting 1 year supply    Return precautions advised.  Garret Reddish, MD

## 2022-05-07 ENCOUNTER — Encounter: Payer: Self-pay | Admitting: Cardiovascular Disease

## 2022-05-07 MED ORDER — METOPROLOL SUCCINATE ER 25 MG PO TB24: 12.5000 mg | ORAL_TABLET | Freq: Every day | ORAL | 3 refills | Status: DC

## 2022-05-07 MED ORDER — ROSUVASTATIN CALCIUM 20 MG PO TABS: 20.0000 mg | ORAL_TABLET | Freq: Every day | ORAL | 3 refills | Status: DC

## 2022-05-07 NOTE — Telephone Encounter (Signed)
Pt last seen 08/28/21 with no medication changes made and both active on his med list at the time. Sent to [employer] [facility] order per request.

## 2022-06-15 DIAGNOSIS — Z961 Presence of intraocular lens: Secondary | ICD-10-CM | POA: Diagnosis not present

## 2022-06-15 DIAGNOSIS — H524 Presbyopia: Secondary | ICD-10-CM | POA: Diagnosis not present

## 2022-07-12 ENCOUNTER — Encounter: Payer: Self-pay | Admitting: Family Medicine

## 2022-07-13 ENCOUNTER — Telehealth (INDEPENDENT_AMBULATORY_CARE_PROVIDER_SITE_OTHER): Payer: Medicare HMO | Admitting: Family Medicine

## 2022-07-13 ENCOUNTER — Encounter: Payer: Self-pay | Admitting: Family Medicine

## 2022-07-13 VITALS — BP 138/82 | HR 55 | Temp 97.9°F | Ht 66.0 in | Wt 148.0 lb

## 2022-07-13 DIAGNOSIS — U071 COVID-19: Secondary | ICD-10-CM | POA: Diagnosis not present

## 2022-07-13 NOTE — Progress Notes (Signed)
Phone [phone] Virtual visit via Video note   Subjective:  Chief complaint: Chief Complaint  Patient presents with   Covid Positive    Pt states him and his wife tested positive on yesterday. He c/o high fever of 104 last week along with coughing with brown mucus and sneezing    Our team/I connected with Chapman Fitch at  2:00 PM EDT by a video enabled telemedicine application (doxy.me or caregility through epic) and verified that I am speaking with the correct person using two identifiers. No physical exam was performed (except for noted visual exam or audio findings with Telehealth visits).   Location patient: Home[place] Location provider: [facility], office Persons participating in the virtual visit:  patient   The patient expressed consent for telemedicine visit and agreed to proceed. Patient understands insurance will be billed.   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 Swallow whole. 90 tablet 3   cetirizine (ZYRTEC) 10 MG tablet Take 10 mg by mouth at bedtime.     clobetasol (TEMOVATE) 0.05 % external solution clobetasol 0.05 % scalp solution  APPLY TO AFFECTED AREA TWICE A DAY     famotidine (PEPCID) 20 MG tablet Take by mouth at bedtime.     metoprolol succinate (TOPROL-XL) 25 MG 24 hr tablet Take 0.5 tablets (12.5 mg total) by mouth daily. 45 tablet 3   Multiple Vitamins-Minerals (CENTRUM SILVER PO) Take by mouth daily.      Omega-3 Fatty Acids (SALMON OIL-1000) 200 MG CAPS Take by mouth daily.     rosuvastatin (CRESTOR) 20 MG tablet Take 1 tablet (20 mg total) by mouth daily. 90 tablet 3   nitroGLYCERIN (NITROSTAT) 0.4 MG SL tablet Place 1 tablet (0.4 mg total) under the tongue every 5 (five) minutes as needed for chest pain. 25 tablet 3   No current facility-administered medications for this visit.     Objective:  BP 138/82   Pulse (!) 55   Temp 97.9 F (36.6 C)   Ht '5\' 6"'$  (1.676 m)   Wt 148 lb (67.1 kg)   SpO2 97%   BMI 23.89 kg/m  self reported vitals Gen: NAD, resting comfortably Lungs: nonlabored, normal respiratory rate  Skin: appears dry, no obvious rash     Assessment and Plan   # Covid 19 S:last week had fever 104 . First symptom on 07/05/22. Also coughing with brown mucus and sneezing. In the home other than taking wife out for infusion for MS. Some sweats last night but has not measured a fever recently- wonders if was febrile last evening. Noted some chest congestion on upper left- but feels better today. Has significant sinus pressure but also gets allergies. Has some weakness.  He recently tested positive at home.  Unfortunately his wife is now in the hospital with COVID-19 - no shortness of breath or wheeze other than slightly winded with stairs  A/P: Patient with COVID-19 but past window for starting paxlovid-encouraged continued conservative care with aggressive hydration and rest - If has new or worsening symptoms to let us know immediately-discussed risk of secondary bacterial infection particularly with his sinuses and he will call if symptoms worsen again or fail to improve over the next week  Recommended follow up:  Future Appointments  Date Time Provider [place]  07/29/2022  3:15 PM Rankin, Clent Demark, MD RDE-RDE None  02/17/2023  9:00 AM Marin Olp, MD [facility]    Lab/Order associations:   ICD-10-CM   1. COVID-19  U07.1       Return precautions advised.   Garret Reddish, MD

## 2022-07-13 NOTE — Patient Instructions (Addendum)
Call us if symptoms fail to improve over the next week or if symptoms worsen   Recommended follow up: Return for as needed for new, worsening, persistent symptoms.

## 2022-07-22 ENCOUNTER — Telehealth: Payer: Self-pay | Admitting: Family Medicine

## 2022-07-22 MED ORDER — AMOXICILLIN-POT CLAVULANATE 875-125 MG PO TABS: 1.0000 | ORAL_TABLET | Freq: Two times a day (BID) | ORAL | 0 refills | Status: AC

## 2022-07-22 NOTE — Telephone Encounter (Signed)
Patient nearly 3 weeks out from first symptoms of COVID-19.  We saw him for a video visit but he was past the date to start Paxlovid.  Continues to have significant congestion and postnasal drip-potentially secondary bacterial infection given duration-we opted to send an antibiotic. Augmentin for secondary bacterial infeciton- he may still try to wait and see how he progresses but I'm out of the office next week so wanted him to have this on hand.

## 2022-07-29 ENCOUNTER — Encounter (INDEPENDENT_AMBULATORY_CARE_PROVIDER_SITE_OTHER): Payer: 59 | Admitting: Ophthalmology

## 2022-08-23 ENCOUNTER — Ambulatory Visit: Payer: 59 | Admitting: Dermatology

## 2022-10-04 DIAGNOSIS — H35343 Macular cyst, hole, or pseudohole, bilateral: Secondary | ICD-10-CM | POA: Diagnosis not present

## 2022-10-04 DIAGNOSIS — Z9889 Other specified postprocedural states: Secondary | ICD-10-CM | POA: Diagnosis not present

## 2022-11-03 ENCOUNTER — Encounter: Payer: Self-pay | Admitting: Family Medicine

## 2022-11-05 NOTE — Telephone Encounter (Signed)
See below

## 2022-12-02 ENCOUNTER — Encounter: Payer: Self-pay | Admitting: Cardiovascular Disease

## 2022-12-02 NOTE — Progress Notes (Signed)
Cardiology Office Note:    Date:  12/03/2022   ID:  Brett Ross, DOB [birth year], MRN [id number]  PCP:  Shelva Majestic, MD  [facility] Cardiologist:  New - Brett Ross  [facility] Electrophysiologist:  None   Referring MD: Shelva Majestic, MD   Chief Complaint  Patient presents with   Hyperlipidemia   Chest Pain    previous notes.    Brett Ross is a 77 y.o. male with a hx of chest pain , hyperlipidemia.   We were asked to see him today by Orland Mustard, MD for further evaluation of his chest pain .   prevously saw Dr. Eden Emms  For past several months, has been waking up with arm tingling .  Saw his primary MD   Several weeks ago , he was pushing his wife in wheelchair , 100 + degree day .   Developed chest pain when he was through. Lasted 20 min or so.  + diaphoresis,    Has not felt well since that Went to primary MD ECG on Aug. 20, 2021 showed NSR ,  No ST or T wave changes No regular exercise except for mowing his yard.    Brother ( smoker) had extensive CAD  2nd brother also has significant CAD  Mother had cad Father died of leukemia at age 27.   Labs from 8/20 were reviewed  - trigs are 228.  Sept. 24, 2021   [name] is seen today for follow up of his chest pain / UAP  Coronary CT angiogram revealed a Coronary calcium score of 476. 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  FFR revealed significant disease in the RCA.   He has been asymptomatic . Our plan was to continue medical therapy for now but have a low threshold to refer for cath if he has any additional CP  We tried Imdur but he had to stop it due to side effects.  He continues to have more chest pain , not quite as severe but more frequent  More chest pain if he lies on his left side.   Jan. 24, 2022 Brett Ross is seen today for follow up of his CAD  Coronary CT angiogram  revealed a Coronary calcium score of 476. 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  FFR revealed significant disease in the RCA.   He has been asymptomatic. He saw a cardiologist in [place] and his a stent placed in his RCA  ( Dec. 22, 2021)  Is on Effient and ASA He was not having any additional symptoms  Labs from last week were reviewed.  His total cholesterol is 165.  HDL is 60.  Triglycerides are 107.  LDL is 86.  August 28, 2021 Brett Ross is seen for follow up of his CAD .  Is back doing yard work .   He typically goes to [place] for Christmas and through the holidays.  He had some chest pain while there.  Work-up in the emergency room was unremarkable.  He had a stress test which was unremarkable.  He has not had any further episodes since then.  Aug. 2, 2024 Brett Ross is seen for follow up of his CAD  Has had stenting of his RCA   Not getting regular exercise  Takes care of his invalid wife ( has MS, wheelchair bound )  Having more indigestion  Not similar to his previous angina  Rec OTC omeprazole 20 mg a day    Past Medical History:  Diagnosis Date   Allergy    Arthritis    CAD (coronary artery disease) 2021   Cataract    bilateral cateracts removed with implants bilateraly   Detached retina    L 04/2014  Right 1990   Environmental allergies    GERD (gastroesophageal reflux disease)    HYPERLIPIDEMIA 02/06/2007   Macular pigment epithelial tear    bilateral eyes    Past Surgical History:  Procedure Laterality Date   cataract surgery     01/09/15 left. right 2018.    COLONOSCOPY     CORONARY ANGIOPLASTY WITH STENT PLACEMENT  04/2020   INGUINAL HERNIA REPAIR     bilateral   KNEE ARTHROSCOPY  2007   left   POLYPECTOMY     VITRECTOMY     06/2014 left   VITRECTOMY     10/2017    Current Medications: Current Meds  Medication Sig   aspirin EC 81 MG tablet Take 1 tablet  (81 mg total) by mouth daily. Swallow whole.   cetirizine (ZYRTEC) 10 MG tablet Take 10 mg by mouth at bedtime.   famotidine (PEPCID) 20 MG tablet Take by mouth at bedtime.   metoprolol succinate (TOPROL-XL) 25 MG 24 hr tablet Take 0.5 tablets (12.5 mg total) by mouth daily.   Multiple Vitamins-Minerals (CENTRUM SILVER PO) Take by mouth daily.   Omega-3 Fatty Acids (SALMON OIL-1000) 200 MG CAPS Take by mouth daily.   omeprazole (PRILOSEC) 20 MG capsule Take 1 capsule (20 mg total) by mouth daily.   rosuvastatin (CRESTOR) 20 MG tablet Take 1 tablet (20 mg total) by mouth daily.     Allergies:   Caffeine, Molds & smuts, Other, Sulfites, and Adhesive [tape]   Social History   Socioeconomic History   Marital status: Married    Spouse name: Not on file   Number of children: Not on file   Years of education: Not on file   Highest education level: Not on file  Occupational History   Not on file  Tobacco Use   Smoking status: Never   Smokeless tobacco: Never  Vaping Use   Vaping status: Never Used  Substance and Sexual Activity   Alcohol use: Yes    Alcohol/week: 7.0 standard drinks of alcohol    Types: 7 Cans of beer per week   Drug use: No   Sexual activity: Not on file  Other Topics Concern   Not on file  Social History Narrative   Married and has a step son.       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 There is no history of Colon cancer, Rectal cancer, Stomach cancer, or Esophageal cancer.  ROS:   Please see the history of present illness.     All other systems reviewed and are negative.  EKGs/Labs/Other Studies Reviewed:    The following studies were reviewed today:   EKG:  EKG Interpretation Date/Time:  Friday December 03 2022 10:25:08 EDT Ventricular Rate:  59 PR Interval:  134 QRS Duration:  88 QT Interval:  422 QTC Calculation: 417 R Axis:   47  Text Interpretation: Sinus bradycardia Nonspecific ST abnormality No previous ECGs available Confirmed by Kristeen Miss ([id number]) on 12/03/2022 10:39:34 AM      Recent Labs: 02/15/2022: ALT 19; BUN 14; Creatinine, Ser 0.89; Hemoglobin 15.2; Platelets 239.0; Potassium 4.3; Sodium 141  Recent Lipid Panel    Component Value Date/Time   CHOL 143 02/15/2022 1048   CHOL 157 08/25/2020 0802   TRIG 157.0 (H) 02/15/2022 1048   HDL 59.80 02/15/2022 1048   HDL 58 08/25/2020 0802   CHOLHDL 2 02/15/2022 1048   VLDL 31.4 02/15/2022 1048   LDLCALC 52 02/15/2022 1048   LDLCALC 77 08/25/2020 0802   LDLCALC 70 12/21/2019 1356    Physical Exam:     Physical Exam: Blood pressure 120/60, pulse (!) 59, height 5\' 6"  (1.676 m), weight 157 lb (71.2 kg), SpO2 95%.       GEN:  Well nourished, well developed in no acute distress HEENT: Normal NECK: No JVD; No carotid bruits LYMPHATICS: No lymphadenopathy CARDIAC: RRR , no murmurs, rubs, gallops RESPIRATORY:  Clear to auscultation without rales, wheezing or rhonchi  ABDOMEN: Soft, non-tender, non-distended MUSCULOSKELETAL:  No edema; No deformity  SKIN: Warm and dry NEUROLOGIC:  Alert and oriented x 3     ASSESSMENT:    1. Coronary artery disease involving native coronary artery of native heart without angina pectoris   2. Hyperlipidemia, unspecified hyperlipidemia type      PLAN:    1.  Coronary artery disease: Status  post stenting of his right coronary artery.  He is having some indigestion but no symptoms that are worrisome for angina.  I have asked him to get some over-the-counter omeprazole.  He will check in with his general medical doctor.  Will check lipids today.   2.  Hyperlipidemia: Continue current medications.  Check lipids, ALT, basic metabolic profile today.     Medication Adjustments/Labs and Tests Ordered: Current medicines are reviewed at length with the patient today.  Concerns regarding medicines are outlined above.  Orders Placed This Encounter  Procedures   Lipid panel   ALT   Basic metabolic panel   EKG 12-Lead   Meds ordered this encounter  Medications   omeprazole (PRILOSEC) 20 MG capsule    Sig: Take 1 capsule (20 mg total) by mouth daily.    Dispense:  90 capsule    Refill:  3    Patient Instructions  Medication Instructions:  START Omeprazole 20mg  daily *If you need a refill on your cardiac medications before your next appointment, please call your pharmacy*   Lab Work: Lipids, ALT, BMET today If you have labs (blood work) drawn today and your tests are completely normal, you will receive your results only by: MyChart Message (if you have MyChart) OR A paper copy in the mail If you have any lab test that is abnormal or we need to change your treatment, we will call you to review the results.   Testing/Procedures: NONE  Follow-Up: At [facility], you and your health needs are our priority.  As part of our continuing mission to provide you with exceptional heart care, we have created designated Provider Care Teams.  These Care Teams include your primary Cardiologist (physician) and Advanced Practice Providers (APPs -  Physician Assistants and Nurse Practitioners) who all work together to provide you with the care you need, when you need it.  We recommend signing up for the patient portal called "MyChart".  Sign up information is provided on this  After Visit Summary.  MyChart is used to connect with patients for Virtual Visits (Telemedicine).  Patients are able to view lab/test results, encounter notes, upcoming appointments, etc.  Non-urgent messages can be sent to your provider as well.   To learn more about what you can do with MyChart, go to [email or web address].    Your next appointment:   1 year(s)  Provider:   Kristeen Miss, MD        Signed, Kristeen Miss, MD  12/03/2022 10:45 AM     [facility]

## 2022-12-03 ENCOUNTER — Encounter: Payer: Self-pay | Admitting: Cardiovascular Disease

## 2022-12-03 ENCOUNTER — Ambulatory Visit: Payer: Medicare HMO | Attending: Cardiovascular Disease | Admitting: Cardiovascular Disease

## 2022-12-03 VITALS — BP 120/60 | HR 59 | Ht 66.0 in | Wt 157.0 lb

## 2022-12-03 DIAGNOSIS — I2510 Atherosclerotic heart disease of native coronary artery without angina pectoris: Secondary | ICD-10-CM

## 2022-12-03 DIAGNOSIS — E785 Hyperlipidemia, unspecified: Secondary | ICD-10-CM

## 2022-12-03 MED ORDER — OMEPRAZOLE 20 MG PO CPDR: 20.0000 mg | DELAYED_RELEASE_CAPSULE | Freq: Every day | ORAL | 3 refills | Status: DC

## 2022-12-03 NOTE — Patient Instructions (Signed)
Medication Instructions:  START Omeprazole 20mg  daily *If you need a refill on your cardiac medications before your next appointment, please call your pharmacy*   Lab Work: Lipids, ALT, BMET today If you have labs (blood work) drawn today and your tests are completely normal, you will receive your results only by: MyChart Message (if you have MyChart) OR A paper copy in the mail If you have any lab test that is abnormal or we need to change your treatment, we will call you to review the results.   Testing/Procedures: NONE   Follow-Up: At [facility], you and your health needs are our priority.  As part of our continuing mission to provide you with exceptional heart care, we have created designated Provider Care Teams.  These Care Teams include your primary Cardiologist (physician) and Advanced Practice Providers (APPs -  Physician Assistants and Nurse Practitioners) who all work together to provide you with the care you need, when you need it.  We recommend signing up for the patient portal called "MyChart".  Sign up information is provided on this After Visit Summary.  MyChart is used to connect with patients for Virtual Visits (Telemedicine).  Patients are able to view lab/test results, encounter notes, upcoming appointments, etc.  Non-urgent messages can be sent to your provider as well.   To learn more about what you can do with MyChart, go to [email or web address].    Your next appointment:   1 year(s)  Provider:   Kristeen Miss, MD

## 2022-12-07 ENCOUNTER — Telehealth: Payer: Self-pay

## 2022-12-07 DIAGNOSIS — Z79899 Other long term (current) drug therapy: Secondary | ICD-10-CM

## 2022-12-07 DIAGNOSIS — E785 Hyperlipidemia, unspecified: Secondary | ICD-10-CM

## 2022-12-07 MED ORDER — EZETIMIBE 10 MG PO TABS
10.0000 mg | ORAL_TABLET | Freq: Every day | ORAL | 3 refills | Status: DC
Start: 2022-12-07 — End: 2023-10-11

## 2022-12-07 NOTE — Addendum Note (Signed)
Addended by: Lars Mage on: 12/07/2022 12:59 PM   Modules accepted: Orders

## 2022-12-07 NOTE — Telephone Encounter (Signed)
-----   Message from Kristeen Miss sent at 12/06/2022  5:39 PM EDT ----- Brett Ross are slightly elevated  His goal LDL I s< 70 Current level is 71  Add zetia 10 mg  Check labs in 3 months

## 2022-12-07 NOTE — Telephone Encounter (Signed)
Zetia sent to [facility] per patient's request. Labs entered and scheduled for 03/18/23.

## 2022-12-07 NOTE — Telephone Encounter (Signed)
Called and spoke with patient who agrees to plan

## 2022-12-28 DIAGNOSIS — M1811 Unilateral primary osteoarthritis of first carpometacarpal joint, right hand: Secondary | ICD-10-CM | POA: Diagnosis not present

## 2022-12-28 DIAGNOSIS — M79644 Pain in right finger(s): Secondary | ICD-10-CM | POA: Diagnosis not present

## 2023-01-14 DIAGNOSIS — M19041 Primary osteoarthritis, right hand: Secondary | ICD-10-CM | POA: Diagnosis not present

## 2023-02-17 ENCOUNTER — Encounter: Payer: 59 | Admitting: Family Medicine

## 2023-03-18 ENCOUNTER — Ambulatory Visit: Payer: Medicare HMO | Attending: Cardiovascular Disease

## 2023-03-18 DIAGNOSIS — Z79899 Other long term (current) drug therapy: Secondary | ICD-10-CM | POA: Diagnosis not present

## 2023-03-18 DIAGNOSIS — E785 Hyperlipidemia, unspecified: Secondary | ICD-10-CM

## 2023-03-19 LAB — LIPID PANEL
Chol/HDL Ratio: 2 ratio (ref 0.0–5.0)
Cholesterol, Total: 138 mg/dL (ref 100–199)
HDL: 69 mg/dL (ref >39)
LDL Chol Calc (NIH): 52 mg/dL (ref 0–99)
Triglycerides: 88 mg/dL (ref 0–149)
VLDL Cholesterol Cal: 17 mg/dL (ref 5–40)

## 2023-03-19 LAB — ALT: ALT: 27 [IU]/L (ref 0–44)

## 2023-04-14 DIAGNOSIS — M25562 Pain in left knee: Secondary | ICD-10-CM | POA: Diagnosis not present

## 2023-04-15 ENCOUNTER — Encounter: Payer: Self-pay | Admitting: Cardiovascular Disease

## 2023-06-02 ENCOUNTER — Encounter: Payer: Self-pay | Admitting: Family Medicine

## 2023-06-02 ENCOUNTER — Ambulatory Visit (INDEPENDENT_AMBULATORY_CARE_PROVIDER_SITE_OTHER): Payer: Medicare Other | Admitting: Family Medicine

## 2023-06-02 VITALS — BP 112/68 | HR 70 | Temp 97.6°F | Ht 66.0 in | Wt 163.2 lb

## 2023-06-02 DIAGNOSIS — Z0000 Encounter for general adult medical examination without abnormal findings: Secondary | ICD-10-CM | POA: Diagnosis not present

## 2023-06-02 DIAGNOSIS — R6882 Decreased libido: Secondary | ICD-10-CM

## 2023-06-02 DIAGNOSIS — I2510 Atherosclerotic heart disease of native coronary artery without angina pectoris: Secondary | ICD-10-CM | POA: Diagnosis not present

## 2023-06-02 DIAGNOSIS — E785 Hyperlipidemia, unspecified: Secondary | ICD-10-CM

## 2023-06-02 NOTE — Patient Instructions (Addendum)
Health Maintenance Due  Topic Date Due   Medicare Annual Wellness (AWV)  Never done  You are eligible to schedule your annual wellness visit with our nurse specialist Inetta Fermo.  Please consider scheduling this before you leave today  Schedule a lab visit at the check out desk within 2 weeks. Return for future fasting labs meaning nothing but water after midnight please. Ok to take your medications with water.  -labs have to be between 8-9 am fasting for accurate testosterone readings  Recommended follow up: Return in about 1 year (around 06/01/2024) for physical or sooner if needed.Schedule b4 you leave.

## 2023-06-02 NOTE — Progress Notes (Signed)
Phone: [phone]   Subjective:  Patient presents today for their annual physical. Chief complaint-noted.   See problem oriented charting- ROS- full  review of systems was completed and negative  except for: right thumb discomfort , left knee pain  The following were reviewed and entered/updated in epic: Past Medical History:  Diagnosis Date   Allergy    Arthritis    CAD (coronary artery disease) 2021   Cataract    bilateral cateracts removed with implants bilateraly   Detached retina    L 04/2014  Right 1990   Environmental allergies    GERD (gastroesophageal reflux disease)    HYPERLIPIDEMIA 02/06/2007   Macular pigment epithelial tear    bilateral eyes   Patient Active Problem List   Diagnosis Date Noted   CAD (coronary artery disease) 05/26/2020    Priority: High   Palpitations 11/22/2013    Priority: Medium    Hyperlipidemia 02/06/2007    Priority: Medium    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 right 2018.    COLONOSCOPY     CORONARY ANGIOPLASTY WITH STENT PLACEMENT  04/2020   INGUINAL HERNIA REPAIR     bilateral   KNEE ARTHROSCOPY  2007   left   POLYPECTOMY     VITRECTOMY     06/2014 left   VITRECTOMY     10/2017    Family History  Problem Relation Age of Onset   Heart failure Mother        CHF and heart disease; had breast cancer in 15s   Breast cancer Mother    Leukemia Father    Melanoma Brother    Heart disease Brother 53       mi, stent   Heart disease Brother 66       MI-required stent   Colon cancer Neg Hx    Rectal cancer Neg Hx    Stomach cancer Neg Hx    Esophageal cancer Neg Hx     Medications- reviewed and updated Current Outpatient Medications  Medication Sig Dispense Refill   aspirin EC 81 MG  tablet Take 1 tablet (81 mg total) by mouth daily. Swallow whole. 90 tablet 3   cetirizine (ZYRTEC) 10 MG tablet Take 10 mg by mouth at bedtime.     clobetasol (TEMOVATE) 0.05 % external solution      ezetimibe (ZETIA) 10 MG tablet Take 1 tablet (10 mg total) by mouth daily. 90 tablet 3   famotidine (PEPCID) 20 MG tablet Take by mouth at bedtime.     metoprolol succinate (TOPROL-XL) 25 MG 24 hr tablet Take 0.5 tablets (12.5 mg total) by mouth daily. 45 tablet 3   Multiple Vitamins-Minerals (CENTRUM SILVER PO) Take by mouth daily.     Omega-3 Fatty Acids (SALMON OIL-1000) 200 MG CAPS Take by mouth daily.     omeprazole (PRILOSEC) 20 MG capsule Take 1 capsule (20 mg total) by mouth daily. 90 capsule 3   rosuvastatin (CRESTOR) 20 MG tablet Take 1 tablet (20 mg total) by mouth daily. 90 tablet 3   nitroGLYCERIN (NITROSTAT) 0.4 MG SL tablet Place 1 tablet (0.4 mg total) under the tongue every 5 (five) minutes as needed for chest pain. 25 tablet 3  No current facility-administered medications for this visit.    Allergies-reviewed and updated Allergies  Allergen Reactions   Caffeine     REACTION: tinnitis   Molds & Smuts     Pt does not remember reaction   Other     [place] [employer]  Reaction causes sneezing and nasal congerstion   Sulfites     Per patient he has difficulty sleeping   Adhesive [Tape] Rash    Social History   Social History Narrative   Married and has a step son.       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 Oropharynx normal Neck: no thyromegaly CV: RRR no murmurs rubs or gallops Lungs:  CTAB no crackles, wheeze, rhonchi Abdomen: soft/nontender/nondistended/normal bowel sounds. No rebound or guarding.  Ext: no edema Skin: warm, dry Neuro: grossly normal, moves all extremities, PERRLA Declines genitourinary and rectal    Assessment and Plan  78 y.o. male presenting for annual physical.  Health Maintenance counseling: 1. Anticipatory guidance: Patient counseled regarding regular dental exams -q6 months, eye exams -yearly with Dr. Costella Hatcher sees Dr. Luciana Axe every 1-2 years,  avoiding smoking and second hand smoke, limiting alcohol to 2 beverages per day - beer daily, no illicit drugs .   2. Risk factor reduction:  Advised patient of need for regular exercise and diet rich and fruits and vegetables to reduce risk of heart attack and stroke.  Exercise- helping wife is causing a lot of physical strain and time limitations- no regular eercise outside of that.  Diet/weight management-weight up 3 pounds of last physical- was at sister in laws without as many options on food and likely gained some Wt Readings from Last 3 Encounters:  06/02/23 163 lb 3.2 oz (74 kg)  12/03/22 157 lb (71.2 kg)  07/13/22 148 lb (67.1 kg)  3. Immunizations/screenings/ancillary studies-up-to-date on vaccines  Immunization History  Administered Date(s) Administered   Fluad Quad(high Dose 65+) 01/30/2019, 03/04/2020, 02/12/2021, 01/19/2022   H1N1 04/16/2008   Influenza Whole 02/01/2008, 02/04/2009, 02/01/2011   Influenza, High Dose Seasonal PF 01/26/2016, 01/17/2017, 02/16/2018, 03/18/2023   Influenza,inj,Quad PF,6+ Mos 01/17/2015   Influenza-Unspecified 02/14/2014   PFIZER(Purple Top)SARS-COV-2 Vaccination 06/29/2019, 07/24/2019, 02/16/2020   Pfizer Covid-19 Vaccine Bivalent Booster 58yrs & up 03/11/2021   Pfizer(Comirnaty)Fall Seasonal Vaccine 12 years and older 02/15/2023   Pneumococcal Conjugate-13 01/17/2015   Pneumococcal Polysaccharide-23 09/18/2012   Td 10/07/2008   Tdap 02/12/2021   Zoster  Recombinant(Shingrix) 12/23/2017, 03/17/2018   Zoster, Live 06/11/2011  4. Prostate cancer screening-   nocturia once or twisce a night stable-we stop screening for prostate cancer around age 49 with plan to restart only if symptomatic changes  Lab Results  Component Value Date   PSA 1.26 01/30/2019   PSA 1.62 12/19/2017   PSA 1.27 12/14/2016   5. Colon cancer screening - 08/27/21 with 7 year repeat  6. Skin cancer screening-followed with Dr. Jorja Loa, now not seeing anyone- may see Dr. Billee Cashing (neighbor) . advised regular sunscreen  use. Denies worrisome, changing, or new skin lesions.  7. Smoking associated screening (lung cancer screening, AAA screen 65-75, UA)-never smoker  8. STD screening -only active with wife     Status of chronic or acute concerns   #CAD-follows with Dr. Elease Hashimoto -cath in [facility] Dr. Sharyon Medicus 04/18/20 with stent #hyperlipidemia S: Medication:Aspirin 81 mg, metoprolol 12.5 mg extended release daily (also helps with history of palpitations), fish oil, rosuvastatin 20 mg daily, zetia 10 mg (soft stool twice a day on this) - Has nitroglycerin on hand if needed - Symptoms:no chest pain or shortness of breath   Lab Results  Component Value Date   CHOL 138 03/18/2023   HDL 69 03/18/2023   LDLCALC 52 03/18/2023   TRIG 88 03/18/2023   CHOLHDL 2.0 03/18/2023   A/P: doing well asymptomatic - continue current medications    # GERD S:Medication: omeprazole 20 mg (intermittent), pepcid complete controls for most part  (almost all the time A/P: reasonable control- continue current medications    # Right thumb pain-previously reported dosing recommended trial of Voltaren but he reports minimal effectiveness. Has seen Dr. Merlyn Lot- given options of injection vs fusion- has upcoming appointment with murphy/wainer -also having some fingers in other fingers or toe or two- uses Voltaren.  -tylenol arthritis doesn't help him.   # Left knee pain- had injection but minimal benefit-  perhaps a week- was with Dr. Madelon Lips- they did not discuss next options- discussed possible gel injections  #Low libido- has noted decrease with time- would like to evaluate testosterone.  - morning erections seldom but still can get daytime erections  Recommended follow up: Return in about 1 year (around 06/01/2024) for physical or sooner if needed.Schedule b4 you leave.  Lab/Order associations:NOT fasting   ICD-10-CM   1. Preventative health care  Z00.00     2. Coronary artery disease involving native coronary artery of native heart without angina pectoris  I25.10     3. Hyperlipidemia, unspecified hyperlipidemia type  E78.5 Comprehensive metabolic panel    CBC with Differential/Platelet    4. Low libido  R68.82       No orders of the defined types were placed in this encounter.   Return precautions advised.  Tana Conch, MD

## 2023-06-03 ENCOUNTER — Encounter: Payer: Self-pay | Admitting: Family Medicine

## 2023-06-03 ENCOUNTER — Other Ambulatory Visit (INDEPENDENT_AMBULATORY_CARE_PROVIDER_SITE_OTHER): Payer: Medicare Other

## 2023-06-03 DIAGNOSIS — E785 Hyperlipidemia, unspecified: Secondary | ICD-10-CM | POA: Diagnosis not present

## 2023-06-03 DIAGNOSIS — R6882 Decreased libido: Secondary | ICD-10-CM

## 2023-06-03 DIAGNOSIS — R7989 Other specified abnormal findings of blood chemistry: Secondary | ICD-10-CM

## 2023-06-03 LAB — CBC WITH DIFFERENTIAL/PLATELET
Basophils Absolute: 0 10*3/uL (ref 0.0–0.1)
Basophils Relative: 0.4 % (ref 0.0–3.0)
Eosinophils Absolute: 0.1 10*3/uL (ref 0.0–0.7)
Eosinophils Relative: 1.5 % (ref 0.0–5.0)
HCT: 44.1 % (ref 39.0–52.0)
Hemoglobin: 14.9 g/dL (ref 13.0–17.0)
Lymphocytes Relative: 29.3 % (ref 12.0–46.0)
Lymphs Abs: 2.1 10*3/uL (ref 0.7–4.0)
MCHC: 33.7 g/dL (ref 30.0–36.0)
MCV: 92.9 fL (ref 78.0–100.0)
Monocytes Absolute: 0.7 10*3/uL (ref 0.1–1.0)
Monocytes Relative: 9.2 % (ref 3.0–12.0)
Neutro Abs: 4.2 10*3/uL (ref 1.4–7.7)
Neutrophils Relative %: 59.6 % (ref 43.0–77.0)
Platelets: 287 10*3/uL (ref 150.0–400.0)
RBC: 4.75 Mil/uL (ref 4.22–5.81)
RDW: 13.5 % (ref 11.5–15.5)
WBC: 7.1 10*3/uL (ref 4.0–10.5)

## 2023-06-03 LAB — COMPREHENSIVE METABOLIC PANEL
ALT: 18 U/L (ref 0–53)
AST: 20 U/L (ref 0–37)
Albumin: 4.3 g/dL (ref 3.5–5.2)
Alkaline Phosphatase: 42 U/L (ref 39–117)
BUN: 13 mg/dL (ref 6–23)
CO2: 27 meq/L (ref 19–32)
Calcium: 8.9 mg/dL (ref 8.4–10.5)
Chloride: 107 meq/L (ref 96–112)
Creatinine, Ser: 0.91 mg/dL (ref 0.40–1.50)
GFR: 82.24 mL/min (ref >60.00)
Glucose, Bld: 94 mg/dL (ref 70–99)
Potassium: 4 meq/L (ref 3.5–5.1)
Sodium: 141 meq/L (ref 135–145)
Total Bilirubin: 0.7 mg/dL (ref 0.2–1.2)
Total Protein: 6.5 g/dL (ref 6.0–8.3)

## 2023-06-03 LAB — TESTOSTERONE: Testosterone: 235.62 ng/dL — ABNORMAL LOW (ref 300.00–890.00)

## 2023-06-06 DIAGNOSIS — M1811 Unilateral primary osteoarthritis of first carpometacarpal joint, right hand: Secondary | ICD-10-CM | POA: Diagnosis not present

## 2023-06-08 DIAGNOSIS — K080 Exfoliation of teeth due to systemic causes: Secondary | ICD-10-CM | POA: Diagnosis not present

## 2023-06-09 ENCOUNTER — Other Ambulatory Visit: Payer: Medicare Other

## 2023-06-15 ENCOUNTER — Other Ambulatory Visit (INDEPENDENT_AMBULATORY_CARE_PROVIDER_SITE_OTHER): Payer: Medicare Other

## 2023-06-15 DIAGNOSIS — R7989 Other specified abnormal findings of blood chemistry: Secondary | ICD-10-CM | POA: Diagnosis not present

## 2023-06-15 LAB — FOLLICLE STIMULATING HORMONE: FSH: 7 m[IU]/mL (ref 1.4–18.1)

## 2023-06-15 LAB — LUTEINIZING HORMONE: LH: 1.96 m[IU]/mL — ABNORMAL LOW (ref 3.10–34.60)

## 2023-06-16 ENCOUNTER — Encounter: Payer: Self-pay | Admitting: Family Medicine

## 2023-06-16 ENCOUNTER — Other Ambulatory Visit: Payer: Medicare Other

## 2023-06-16 LAB — TESTOSTERONE TOTAL,FREE,BIO, MALES
Albumin: 4.1 g/dL (ref 3.6–5.1)
Sex Hormone Binding: 31 nmol/L (ref 22–77)
Testosterone, Bioavailable: 82.1 ng/dL (ref 15.0–150.0)
Testosterone, Free: 43.6 pg/mL (ref 6.0–73.0)
Testosterone: 317 ng/dL (ref 250–827)

## 2023-06-20 DIAGNOSIS — K080 Exfoliation of teeth due to systemic causes: Secondary | ICD-10-CM | POA: Diagnosis not present

## 2023-07-19 ENCOUNTER — Other Ambulatory Visit: Payer: Self-pay

## 2023-07-19 MED ORDER — METOPROLOL SUCCINATE ER 25 MG PO TB24: 12.5000 mg | ORAL_TABLET | Freq: Every day | ORAL | 1 refills | Status: DC

## 2023-07-28 ENCOUNTER — Ambulatory Visit: Payer: Medicare Other

## 2023-07-28 VITALS — BP 112/68 | Ht 66.0 in | Wt 157.0 lb

## 2023-07-28 DIAGNOSIS — Z0000 Encounter for general adult medical examination without abnormal findings: Secondary | ICD-10-CM

## 2023-07-28 NOTE — Patient Instructions (Signed)
 Brett Ross , Thank you for taking time to come for your Medicare Wellness Visit. I appreciate your ongoing commitment to your health goals. Please review the following plan we discussed and let me know if I can assist you in the future.   Referrals/Orders/Follow-Ups/Clinician Recommendations: follow up in one year for medicare annual wellness visit.  This is a list of the screening recommended for you and due dates:  Health Maintenance  Topic Date Due   Hepatitis C Screening  04/30/2098*   COVID-19 Vaccine (6 - Pfizer risk 2024-25 season) 08/16/2023   Medicare Annual Wellness Visit  07/27/2024   Colon Cancer Screening  08/27/2028   DTaP/Tdap/Td vaccine (3 - Td or Tdap) 02/13/2031   Pneumonia Vaccine  Completed   Flu Shot  Completed   Zoster (Shingles) Vaccine  Completed   HPV Vaccine  Aged Out  *Topic was postponed. The date shown is not the original due date.    Advanced directives: (Declined) Advance directive discussed with you today. Even though you declined this today, please call our office should you change your mind, and we can give you the proper paperwork for you to fill out.  Next Medicare Annual Wellness Visit scheduled for next year: Yes

## 2023-07-28 NOTE — Progress Notes (Signed)
 Because this visit was a virtual/telehealth visit,  certain criteria was not obtained, such a blood pressure, CBG if applicable, and timed get up and go. Any medications not marked as "taking" were not mentioned during the medication reconciliation part of the visit. Any vitals not documented were not able to be obtained due to this being a telehealth visit or patient was unable to self-report a recent blood pressure reading due to a lack of equipment at home via telehealth. Vitals that have been documented are verbally provided by the patient.   Subjective:   Brett Ross is a 78 y.o. who presents for a Medicare Wellness preventive visit.  Visit Complete: Virtual I connected with  Brett Ross on 07/28/23 by a audio enabled telemedicine application and verified that I am speaking with the correct person using two identifiers.  Patient Location: Home  Provider Location: Home Office  I discussed the limitations of evaluation and management by telemedicine. The patient expressed understanding and agreed to proceed.  Vital Signs: Because this visit was a virtual/telehealth visit, some criteria may be missing or patient reported. Any vitals not documented were not able to be obtained and vitals that have been documented are patient reported.  VideoDeclined- This patient declined Librarian, academic. Therefore the visit was completed with audio only.  Persons Participating in Visit: Patient.  AWV Questionnaire: No: Patient Medicare AWV questionnaire was not completed prior to this visit.  Cardiac Risk Factors include: advanced age (>24men, >78 women);male gender;dyslipidemia     Objective:    Today's Vitals   07/28/23 1539 07/28/23 1541  BP: 112/68   Weight: 157 lb (71.2 kg)   Height: 5\' 6"  (1.676 m)   PainSc:  6    Body mass index is 25.34 kg/m.     07/28/2023    3:47 PM  Advanced Directives  Does Patient Have a Medical Advance Directive?  No  Would patient like information on creating a medical advance directive? No - Patient declined    Current Medications (verified) Outpatient Encounter Medications as of 07/28/2023  Medication Sig   aspirin EC 81 MG tablet Take 1 tablet (81 mg total) by mouth daily. Swallow whole.   cetirizine (ZYRTEC) 10 MG tablet Take 10 mg by mouth at bedtime.   clobetasol (TEMOVATE) 0.05 % external solution    ezetimibe (ZETIA) 10 MG tablet Take 1 tablet (10 mg total) by mouth daily.   famotidine (PEPCID) 20 MG tablet Take by mouth at bedtime.   metoprolol succinate (TOPROL-XL) 25 MG 24 hr tablet Take 0.5 tablets (12.5 mg total) by mouth daily.   Multiple Vitamins-Minerals (CENTRUM SILVER PO) Take by mouth daily.   Omega-3 Fatty Acids (SALMON OIL-1000) 200 MG CAPS Take by mouth daily.   omeprazole (PRILOSEC) 20 MG capsule Take 1 capsule (20 mg total) by mouth daily.   rosuvastatin (CRESTOR) 20 MG tablet Take 1 tablet (20 mg total) by mouth daily.   nitroGLYCERIN (NITROSTAT) 0.4 MG SL tablet Place 1 tablet (0.4 mg total) under the tongue every 5 (five) minutes as needed for chest pain.   No facility-administered encounter medications on file as of 07/28/2023.    Allergies (verified) Caffeine, Molds & smuts, Other, Sulfites, and Adhesive [tape]   History: Past Medical History:  Diagnosis Date   Allergy    Arthritis    CAD (coronary artery disease) 2021   Cataract    bilateral cateracts removed with implants bilateraly   Detached retina    L 04/2014  Right 1990   Environmental allergies    GERD (gastroesophageal reflux disease)    HYPERLIPIDEMIA 02/06/2007   Macular pigment epithelial tear    bilateral eyes   Past Surgical History:  Procedure Laterality Date   cataract surgery     01/09/15 left. right 2018.    COLONOSCOPY     CORONARY ANGIOPLASTY WITH STENT PLACEMENT  04/2020   INGUINAL HERNIA REPAIR     bilateral   KNEE ARTHROSCOPY  2007   left   POLYPECTOMY     VITRECTOMY      06/2014 left   VITRECTOMY     10/2017   Family History  Problem Relation Age of Onset   Heart failure Mother        CHF and heart disease; had breast cancer in 37s   Breast cancer Mother    Leukemia Father    Melanoma Brother    Heart disease Brother 56       mi, stent   Heart disease Brother 21       MI-required stent   Colon cancer Neg Hx    Rectal cancer Neg Hx    Stomach cancer Neg Hx    Esophageal cancer Neg Hx    Social History   Socioeconomic History   Marital status: Married    Spouse name: Not on file   Number of children: Not on file   Years of education: Not on file   Highest education level: Not on file  Occupational History   Not on file  Tobacco Use   Smoking status: Never   Smokeless tobacco: Never  Vaping Use   Vaping status: Never Used  Substance and Sexual Activity   Alcohol use: Yes    Alcohol/week: 7.0 standard drinks of alcohol    Types: 7 Cans of beer per week   Drug use: No   Sexual activity: Not on file  Other Topics Concern   Not on file  Social History Narrative   Married and has a step son.       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      Enjoys family time   Social Drivers of Health   Financial Resource Strain: Low Risk  (07/28/2023)   Overall Financial Resource Strain (CARDIA)    Difficulty of Paying Living Expenses: Not hard at all  Food Insecurity: No Food Insecurity (07/28/2023)   Hunger Vital Sign    Worried About Running Out of Food in the Last Year: Never true    Ran Out of Food in the Last Year: Never true  Transportation Needs: No Transportation Needs (07/28/2023)   PRAPARE - Administrator, Civil Service (Medical): No    Lack of Transportation (Non-Medical): No  Physical Activity: Insufficiently Active (07/28/2023)   Exercise Vital Sign    Days of Exercise per Week: 1 day    Minutes of  Exercise per Session: 20 min  Stress: No Stress Concern Present (07/28/2023)   [employer] of Occupational Health - Occupational Stress Questionnaire    Feeling of Stress : Only a little  Social Connections: Moderately Isolated (07/28/2023)   Social Connection and Isolation Panel [NHANES]    Frequency of Communication with Friends and Family: More than three times a week    Frequency of Social Gatherings with Friends and Family: More than three times a  week    Attends Religious Services: Never    Active Member of Clubs or Organizations: No    Attends Engineer, structural: Never    Marital Status: Married    Tobacco Counseling Counseling given: Not Answered    Clinical Intake:  Pre-visit preparation completed: Yes  Pain : 0-10 Pain Score: 6  Pain Type: Acute pain Pain Location: Shoulder Pain Orientation: Left, Right Pain Descriptors / Indicators: Aching, Constant, Sore Pain Onset: Today Pain Frequency: Occasional Pain Relieving Factors: take some tylenol  Pain Relieving Factors: take some tylenol  BMI - recorded: 25.34 Nutritional Status: BMI 25 -29 Overweight Nutritional Risks: None, Other (Comment) Diabetes: No  No results found for: "HGBA1C"   What is the last grade level you completed in school?: BS Degree  Interpreter Needed?: No  Information entered by :: Isidoro Donning, CMA   Activities of Daily Living     07/28/2023    3:46 PM  In your present state of health, do you have any difficulty performing the following activities:  Hearing? 0  Vision? 0  Difficulty concentrating or making decisions? 0  Walking or climbing stairs? 0  Dressing or bathing? 0  Doing errands, shopping? 0  Preparing Food and eating ? N  Using the Toilet? N  In the past six months, have you accidently leaked urine? N  Do you have problems with loss of bowel control? N  Managing your Medications? N  Managing your Finances? N  Housekeeping or managing your  Housekeeping? N    Patient Care Team: Shelva Majestic, MD as PCP - General (Family Medicine) Nahser, Deloris Ping, MD as PCP - Cardiology (Cardiology) Maris Berger, MD as Consulting Physician (Ophthalmology) Janalyn Harder, MD (Inactive) as Consulting Physician (Dermatology)  Indicate any recent Medical Services you may have received from other than [facility] providers in the past year (date may be approximate).     Assessment:   This is a routine wellness examination for Brett Ross.  Hearing/Vision screen Hearing Screening - Comments:: Patient declined Vision Screening - Comments:: Patient states his vision is great after sugery   Goals Addressed               This Visit's Progress     Patient Stated (pt-stated)        Patient would like to lose a few lbs        Depression Screen     07/28/2023    3:48 PM 06/02/2023    1:25 PM 02/15/2022   10:27 AM 02/12/2021    9:19 AM 12/21/2019    1:16 PM 01/30/2019    9:46 AM 12/16/2017    8:37 AM  PHQ 2/9 Scores  PHQ - 2 Score 0 0 0 0 0 0 0  PHQ- 9 Score 3 4         Fall Risk     07/28/2023    3:47 PM 06/02/2023    1:25 PM 02/12/2021    9:19 AM 12/21/2019    1:15 PM 12/16/2017    8:37 AM  Fall Risk   Falls in the past year? 0 0 0 0 No  Number falls in past yr: 0 0 0    Injury with Fall? 0 0 0    Risk for fall due to : No Fall Risks No Fall Risks No Fall Risks No Fall Risks   Follow up Falls evaluation completed Falls evaluation completed Falls evaluation completed      MEDICARE RISK AT HOME:  Medicare Risk at Home Any stairs in or around the home?: Yes If so, are there any without handrails?: No Home free of loose throw rugs in walkways, pet beds, electrical cords, etc?: Yes Adequate lighting in your home to reduce risk of falls?: Yes Life alert?: Yes Use of a cane, walker or w/c?: No Grab bars in the bathroom?: No Shower chair or bench in shower?: No Elevated toilet seat or a handicapped toilet?: No  TIMED UP AND  GO:  Was the test performed?  no  Cognitive Function: 6CIT completed        07/28/2023    3:43 PM  6CIT Screen  What Year? 0 points  What month? 0 points  What time? 0 points  Count back from 20 0 points  Months in reverse 0 points  Repeat phrase 0 points  Total Score 0 points    Immunizations Immunization History  Administered Date(s) Administered   Fluad Quad(high Dose 65+) 01/30/2019, 03/04/2020, 02/12/2021, 01/19/2022   H1N1 04/16/2008   Influenza Whole 02/01/2008, 02/04/2009, 02/01/2011   Influenza, High Dose Seasonal PF 01/26/2016, 01/17/2017, 02/16/2018, 03/18/2023   Influenza,inj,Quad PF,6+ Mos 01/17/2015   Influenza-Unspecified 02/14/2014   PFIZER(Purple Top)SARS-COV-2 Vaccination 06/29/2019, 07/24/2019, 02/16/2020   Pfizer Covid-19 Vaccine Bivalent Booster 46yrs & up 03/11/2021   Pfizer(Comirnaty)Fall Seasonal Vaccine 12 years and older 02/15/2023   Pneumococcal Conjugate-13 01/17/2015   Pneumococcal Polysaccharide-23 09/18/2012   Td 10/07/2008   Tdap 02/12/2021   Zoster Recombinant(Shingrix) 12/23/2017, 03/17/2018   Zoster, Live 06/11/2011    Screening Tests Health Maintenance  Topic Date Due   Hepatitis C Screening  04/30/2098 (Originally 07/19/1963)   COVID-19 Vaccine (6 - Pfizer risk 2024-25 season) 08/16/2023   Medicare Annual Wellness (AWV)  07/27/2024   Colonoscopy  08/27/2028   DTaP/Tdap/Td (3 - Td or Tdap) 02/13/2031   Pneumonia Vaccine 81+ Years old  Completed   INFLUENZA VACCINE  Completed   Zoster Vaccines- Shingrix  Completed   HPV VACCINES  Aged Out    Health Maintenance  There are no preventive care reminders to display for this patient. Health Maintenance Items Addressed:none needed at this moment   Additional Screening:  Vision Screening: Recommended annual ophthalmology exams for early detection of glaucoma and other disorders of the eye.  Dental Screening: Recommended annual dental exams for proper oral hygiene  Community  Resource Referral / Chronic Care Management: CRR required this visit?  No   CCM required this visit?  No     Plan:     I have personally reviewed and noted the following in the patient's chart:   Medical and social history Use of alcohol, tobacco or illicit drugs  Current medications and supplements including opioid prescriptions. Patient is not currently taking opioid prescriptions. Functional ability and status Nutritional status Physical activity Advanced directives List of other physicians Hospitalizations, surgeries, and ER visits in previous 12 months Vitals Screenings to include cognitive, depression, and falls Referrals and appointments  In addition, I have reviewed and discussed with patient certain preventive protocols, quality metrics, and best practice recommendations. A written personalized care plan for preventive services as well as general preventive health recommendations were provided to patient.     Rudi Heap, [place]   07/28/2023   After Visit Summary: (MyChart) Due to this being a telephonic visit, the after visit summary with patients personalized plan was offered to patient via MyChart   Notes: Nothing significant to report at this time.

## 2023-09-09 ENCOUNTER — Encounter: Payer: Self-pay | Admitting: Family Medicine

## 2023-09-09 ENCOUNTER — Ambulatory Visit (INDEPENDENT_AMBULATORY_CARE_PROVIDER_SITE_OTHER): Admitting: Family Medicine

## 2023-09-09 VITALS — BP 138/72 | HR 76 | Temp 97.3°F | Ht 66.0 in | Wt 162.8 lb

## 2023-09-09 DIAGNOSIS — R221 Localized swelling, mass and lump, neck: Secondary | ICD-10-CM

## 2023-09-09 DIAGNOSIS — M6281 Muscle weakness (generalized): Secondary | ICD-10-CM

## 2023-09-09 DIAGNOSIS — E785 Hyperlipidemia, unspecified: Secondary | ICD-10-CM | POA: Diagnosis not present

## 2023-09-09 DIAGNOSIS — I2510 Atherosclerotic heart disease of native coronary artery without angina pectoris: Secondary | ICD-10-CM

## 2023-09-09 DIAGNOSIS — M791 Myalgia, unspecified site: Secondary | ICD-10-CM

## 2023-09-09 LAB — CBC WITH DIFFERENTIAL/PLATELET
Basophils Absolute: 0 10*3/uL (ref 0.0–0.1)
Basophils Relative: 0.4 % (ref 0.0–3.0)
Eosinophils Absolute: 0.1 10*3/uL (ref 0.0–0.7)
Eosinophils Relative: 1.5 % (ref 0.0–5.0)
HCT: 44.2 % (ref 39.0–52.0)
Hemoglobin: 15 g/dL (ref 13.0–17.0)
Lymphocytes Relative: 27.6 % (ref 12.0–46.0)
Lymphs Abs: 2.3 10*3/uL (ref 0.7–4.0)
MCHC: 34 g/dL (ref 30.0–36.0)
MCV: 91.2 fl (ref 78.0–100.0)
Monocytes Absolute: 0.8 10*3/uL (ref 0.1–1.0)
Monocytes Relative: 9.6 % (ref 3.0–12.0)
Neutro Abs: 5.1 10*3/uL (ref 1.4–7.7)
Neutrophils Relative %: 60.9 % (ref 43.0–77.0)
Platelets: 277 10*3/uL (ref 150.0–400.0)
RBC: 4.84 Mil/uL (ref 4.22–5.81)
RDW: 13.5 % (ref 11.5–15.5)
WBC: 8.3 10*3/uL (ref 4.0–10.5)

## 2023-09-09 LAB — COMPREHENSIVE METABOLIC PANEL WITH GFR
ALT: 19 U/L (ref 0–53)
AST: 20 U/L (ref 0–37)
Albumin: 4.1 g/dL (ref 3.5–5.2)
Alkaline Phosphatase: 39 U/L (ref 39–117)
BUN: 12 mg/dL (ref 6–23)
CO2: 27 meq/L (ref 19–32)
Calcium: 9.1 mg/dL (ref 8.4–10.5)
Chloride: 105 meq/L (ref 96–112)
Creatinine, Ser: 0.9 mg/dL (ref 0.40–1.50)
GFR: 83.18 mL/min (ref >60.00)
Glucose, Bld: 96 mg/dL (ref 70–99)
Potassium: 3.8 meq/L (ref 3.5–5.1)
Sodium: 140 meq/L (ref 135–145)
Total Bilirubin: 0.6 mg/dL (ref 0.2–1.2)
Total Protein: 6.9 g/dL (ref 6.0–8.3)

## 2023-09-09 LAB — CK: Total CK: 50 U/L (ref 7–232)

## 2023-09-09 LAB — TSH: TSH: 1.84 u[IU]/mL (ref 0.35–5.50)

## 2023-09-09 LAB — C-REACTIVE PROTEIN: CRP: <1 mg/dL (ref 0.5–20.0)

## 2023-09-09 LAB — SEDIMENTATION RATE: Sed Rate: 9 mm/h (ref 0–20)

## 2023-09-09 NOTE — Patient Instructions (Addendum)
 Please stop by lab before you go If you have mychart- we will send your results within 3 business days of us  receiving them.  If you do not have mychart- we will call you about results within 5 business days of us  receiving them.  *please also note that you will see labs on mychart as soon as they post. I will later go in and write notes on them- will say "notes from Dr. Arlene Ben"   We will call you within two weeks about your referral for thyroid ultrasound but also asking them to looka to area at top of sternum through [facility].  Their phone number is [phone].  Please call them if you have not heard in 1-2 weeks  I am concerned for  polymyalgia rheumatica with central muscle weakness and pain that improves as day goes on. Will update bloodwork. If labs point away from this could be related to statin and zetia  - will also check CK level today- if this is elevated or if polymyalgia rheumatica labs are normal I may have him trial off zetia  for 2 weeks then if not improving hold statin as well to see if we get improvement. Since pains are in multiple areas I do not feel strongly about starting with imaging like x-rays unless labs are unrevealing or we don't make improvements with the above   Recommended follow up: Return in about 6 weeks (around 10/21/2023) for followup or sooner if needed.Schedule b4 you leave.

## 2023-09-09 NOTE — Progress Notes (Signed)
 Phone [phone] In person visit   Subjective:   Brett Ross is a 78 y.o. year old very pleasant male patient who presents for/with See problem oriented charting Chief Complaint  Patient presents with   Fatigue   Pain    Pt has been having pain since January in legs and arms.   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 Swallow whole. 90 tablet 3   cetirizine (ZYRTEC) 10 MG tablet Take 10 mg by mouth at bedtime.     clobetasol  (TEMOVATE ) 0.05 % external solution      ezetimibe  (ZETIA ) 10 MG tablet Take 1 tablet (10 mg total) by mouth daily. 90 tablet 3   famotidine (PEPCID) 20 MG tablet Take by mouth at bedtime.     metoprolol  succinate (TOPROL -XL) 25 MG 24 hr tablet Take 0.5 tablets (12.5 mg total) by mouth daily. 45 tablet 1   Multiple Vitamins-Minerals (CENTRUM SILVER PO) Take by mouth daily.     Omega-3 Fatty Acids (SALMON OIL-1000) 200 MG CAPS Take by mouth daily.     omeprazole  (PRILOSEC) 20 MG capsule Take 1 capsule (20 mg total) by mouth daily. 90 capsule 3   rosuvastatin  (CRESTOR ) 20 MG tablet Take 1 tablet (20 mg total) by mouth daily. 90 tablet 3   nitroGLYCERIN  (NITROSTAT ) 0.4 MG SL tablet Place 1 tablet (0.4 mg total) under the tongue every 5 (five) minutes as needed for chest pain. 25 tablet 3   No current facility-administered medications for this visit.     Objective:  BP 138/72   Pulse 76   Temp (!) 97.3 F (36.3  C) (Temporal)   Ht 5\' 6"  (1.676 m)   Wt 162 lb 12.8 oz (73.8 kg)   SpO2 98%   BMI 26.28 kg/m  Gen: NAD, resting comfortably No thyromegaly but at top of sternum 2.5 cm x 2.5 cm soft mobile lesion- has a similar lesion on left upper arm concerning for lipoma No carotid bruits CV: RRR no murmurs rubs or gallops Lungs: CTAB no crackles, wheeze, rhonchi Ext: no edema Skin: warm, dry Neuro: 4/5 muscle strength in abduction and forward flexion of shoulders but 5/5 in flexion and extension lower arm. Similar distribution in lower extremities with 4/5 central strength and 5/5 with lower legs. He has difficulty rising from the chair which is a new finding. Does have pain with shoulder movement in particular- limited range of motion     Assessment and Plan   # Myalgias- more central and with morning stiffness and worsening fatigue S: Patient reports since January noting Muscle aches in both his arms and his legs.  He has also noted some fatigue. Worse in shoulders- even bothering him sleeping. Sore in upper legs and hips as well.  -He does use musculature rather intensely trying to support his wife with MS-a lot of lifting and pulling and pushing but this  seems out of the norm for him even with those activites -He is on rosuvastatin  which increases mildly risk but he has been on that since at least his stent placement in 2021- was started on zetia  10 mg last august  -tylenol not helping - trouble getting out of bed in morning- feels more stiff as well -no chest pain or shortness of breath  -pain and stiffness likely last at least an hour- not just the first few minutes when he wakes up   At top of sternum has also noted some enlargement of tissue here- no thyroid enlargement that he is aware of  A/P: I am concerned for  polymyalgia rheumatica with central muscle weakness and pain that improves as day goes on. Will update bloodwork. If labs point away from this could be related to statin and zetia   - will also check CK level today- if this is elevated or if polymyalgia rheumatica labs are normal I may have him trial off zetia  for 2 weeks then if not improving hold statin as well to see if we get improvement. Since pains are in multiple areas I do not feel strongly about starting with imaging like x-rays unless labs are unrevealing or we don't make improvements with the above  Also ultrasound the thyroid to make sure that only lipoma at top of sternum      #CAD-follows with Dr. Alroy Aspen -cath in [place]  Dr. Oksana Bergamo 04/18/20 with stent #hyperlipidemia S: Medication:Aspirin  81 mg, metoprolol  12.5 mg extended release daily (also helps with history of palpitations), fish oil, rosuvastatin  20 mg daily, zetia  10 mg (soft stool twice a day on this) - Has nitroglycerin  on hand if needed- not needing - Symptoms:as above denies chest pain or shortness of breath- occasionally some stress related mild pressure in chest but not worsening. No issus with mowing lawn- has to do later in day Lab Results  Component Value Date   CHOL 138 03/18/2023   HDL 69 03/18/2023   LDLCALC 52 03/18/2023   TRIG 88 03/18/2023   CHOLHDL 2.0 03/18/2023    A/P: CAD largely asymptomatic but some stress related chest pressure that seems to be largely stable and no exertional pain- if this develops will seek care Lipids have looked great but may need to hold medicines as above- continue current medications for now    # GERD S:Medication: omeprazole  20 mg, plus Pepcid in evening -nightguard with some discolaration still even with this- - not feeling heartburn though A/P: reasonable control- with discoloration alone and no symptoms don't feel strongly about increasing dose   Recommended follow up: Return in about 6 weeks (around 10/21/2023) for followup or sooner if needed.Schedule b4 you leave. Future Appointments  Date Time Provider Department Center  06/05/2024 11:00 AM Almira Jaeger, MD [facility]  08/07/2024  1:00  PM [facility] ANNUAL WELLNESS VISIT 1 [facility]    Lab/Order associations:   ICD-10-CM   1. Muscle weakness  M62.81 Sedimentation rate    C-reactive protein    CK (Creatine Kinase)    2. Myalgia  M79.10 Sedimentation rate    C-reactive protein    CK (Creatine Kinase)    3. Coronary artery disease involving native coronary artery of native heart without angina pectoris  I25.10 Comprehensive metabolic panel with GFR    CBC with Differential/Platelet    TSH    4. Hyperlipidemia, unspecified hyperlipidemia type  E78.5 Lipoprotein A (LPA)    5. Neck mass  R22.1 US  THYROID  No orders of the defined types were placed in this encounter.  Return precautions advised.  Clarisa Crooked, MD

## 2023-09-13 ENCOUNTER — Ambulatory Visit: Payer: Self-pay | Admitting: Family Medicine

## 2023-09-13 ENCOUNTER — Ambulatory Visit
Admission: RE | Admit: 2023-09-13 | Discharge: 2023-09-13 | Disposition: A | Discharge status: Home / Self Care | Source: Ambulatory Visit | Attending: Family Medicine | Admitting: Family Medicine

## 2023-09-13 DIAGNOSIS — R221 Localized swelling, mass and lump, neck: Secondary | ICD-10-CM

## 2023-09-13 DIAGNOSIS — E7841 Elevated Lipoprotein(a): Secondary | ICD-10-CM

## 2023-09-13 DIAGNOSIS — E042 Nontoxic multinodular goiter: Secondary | ICD-10-CM | POA: Diagnosis not present

## 2023-09-13 LAB — LIPOPROTEIN A (LPA): Lipoprotein (a): 215 nmol/L — ABNORMAL HIGH (ref <75)

## 2023-09-18 ENCOUNTER — Encounter: Payer: Self-pay | Admitting: Family Medicine

## 2023-09-19 ENCOUNTER — Other Ambulatory Visit: Payer: Self-pay

## 2023-09-19 MED ORDER — ROSUVASTATIN CALCIUM 20 MG PO TABS: 20.0000 mg | ORAL_TABLET | Freq: Every day | ORAL | 3 refills | Status: DC

## 2023-09-22 ENCOUNTER — Other Ambulatory Visit: Payer: Self-pay

## 2023-09-22 MED ORDER — ROSUVASTATIN CALCIUM 20 MG PO TABS: 20.0000 mg | ORAL_TABLET | Freq: Every day | ORAL | 3 refills | Status: DC

## 2023-09-29 ENCOUNTER — Encounter: Payer: Self-pay | Admitting: Family Medicine

## 2023-10-04 DIAGNOSIS — Z9889 Other specified postprocedural states: Secondary | ICD-10-CM | POA: Diagnosis not present

## 2023-10-04 DIAGNOSIS — H35343 Macular cyst, hole, or pseudohole, bilateral: Secondary | ICD-10-CM | POA: Diagnosis not present

## 2023-10-09 ENCOUNTER — Other Ambulatory Visit: Payer: Self-pay | Admitting: Cardiovascular Disease

## 2023-10-09 DIAGNOSIS — E785 Hyperlipidemia, unspecified: Secondary | ICD-10-CM

## 2023-10-09 DIAGNOSIS — Z79899 Other long term (current) drug therapy: Secondary | ICD-10-CM

## 2023-10-21 ENCOUNTER — Encounter: Payer: Self-pay | Admitting: Family Medicine

## 2023-10-21 ENCOUNTER — Ambulatory Visit: Payer: Self-pay | Admitting: Family Medicine

## 2023-10-21 ENCOUNTER — Ambulatory Visit (INDEPENDENT_AMBULATORY_CARE_PROVIDER_SITE_OTHER): Admitting: Family Medicine

## 2023-10-21 VITALS — BP 122/60 | HR 68 | Temp 97.9°F | Ht 66.0 in | Wt 161.4 lb

## 2023-10-21 DIAGNOSIS — L989 Disorder of the skin and subcutaneous tissue, unspecified: Secondary | ICD-10-CM

## 2023-10-21 DIAGNOSIS — I2510 Atherosclerotic heart disease of native coronary artery without angina pectoris: Secondary | ICD-10-CM

## 2023-10-21 DIAGNOSIS — E785 Hyperlipidemia, unspecified: Secondary | ICD-10-CM

## 2023-10-21 DIAGNOSIS — M25512 Pain in left shoulder: Secondary | ICD-10-CM

## 2023-10-21 DIAGNOSIS — G720 Drug-induced myopathy: Secondary | ICD-10-CM | POA: Diagnosis not present

## 2023-10-21 DIAGNOSIS — M25511 Pain in right shoulder: Secondary | ICD-10-CM

## 2023-10-21 LAB — SEDIMENTATION RATE: Sed Rate: 9 mm/h (ref 0–20)

## 2023-10-21 LAB — C-REACTIVE PROTEIN: CRP: <1 mg/dL (ref 0.5–20.0)

## 2023-10-21 NOTE — Progress Notes (Signed)
 Phone [phone] In person visit   Subjective:   Brett Ross is a 78 y.o. 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 Swallow whole. 90 tablet 3   famotidine (PEPCID) 20 MG tablet Take by mouth at bedtime.     metoprolol  succinate (TOPROL -XL) 25 MG 24 hr tablet Take 0.5 tablets (12.5 mg total) by mouth daily. 45 tablet 1   Multiple Vitamins-Minerals (CENTRUM SILVER PO) Take by mouth daily.     nitroGLYCERIN  (NITROSTAT ) 0.4 MG SL tablet Place 1 tablet (0.4 mg total) under the tongue every 5 (five) minutes as needed for chest pain. 25 tablet 3   Omega-3 Fatty Acids (SALMON OIL-1000) 200 MG CAPS Take 2,000 mg by mouth daily.     omeprazole  (PRILOSEC) 20 MG capsule Take 1 capsule (20 mg total) by mouth daily. 90 capsule 3   cetirizine (ZYRTEC) 10 MG tablet Take 10 mg by mouth at bedtime. (Patient not taking: Reported on 10/21/2023)     clobetasol  (TEMOVATE ) 0.05 % external solution  (Patient not taking: Reported on 10/21/2023)     No current facility-administered medications for this visit.     Objective:  BP 122/60   Pulse 68    Temp 97.9 F (36.6 C) (Temporal)   Ht 5' 6 (1.676 m)   Wt 161 lb 6.4 oz (73.2 kg)   SpO2 95%   BMI 26.05 kg/m  Gen: NAD, resting comfortably CV: RRR no murmurs rubs or gallops Lungs: CTAB no crackles, wheeze, rhonchi Ext: no edema Skin: warm, dry, mildly hyperpigmented patch on right upper chest Neuro: Mildly weak with shoulder abduction but appears to be related to pain Positive signs of impingement with Neer, Hawkins, empty can test bilaterally.  No pain with neck movement.  Palpation over shoulders and musculature of upper extremities with no pain    Assessment and Plan    #Myalgias- muscle pain has improved off of rosuvastatin  and Zetia - we just made this change about 3 weeks ago through [employer]. Still notes some weakness but slightly better.  -Until we can figure out his shoulder pain issues  # Bilateral shoulder pain  S: Despite myalgias improving-now he is having more joint issues- primarily shoulders. Hips and knees can also bother him. Does still have to pull heavily for wife with multiple sclerosis.  - the shoulder pain is worse in the morning and feels weak getting out of bed BUT ESR and CRP were not elevated. A good nights rest he actually feels about he worst the next  day. Pain can improve during the day. Joints are not stiff just pain. No neck pain.  -also notes some numbness into both hands and mild swelling.  -a lot of strain pulling his wife into her chair A/P: Signs of impingement on exam bilaterally and initially referred to [facility] and offered prednisone -we opted to wait on prednisone  until we get their evaluation.  I thought possibly could be a cervical disc disease/neck issue with bilateral hand tingling at times but majority of pain really seems to be in the shoulders we will start with that evaluation - After patient left the office I was thinking further about his case (and the fact that he has worsening shoulder pain in the morning that  improves as the day goes on as well as difficulty getting out of the bed in the morning from hips perspective) and the sed rate and CRP were about 6 weeks ago when the shoulder symptoms were not present so opted to have him back for ESR and CRP just to be on the safe side to rule out polymyalgia rheumatica but will still move forward with referral to orthopedics unless these are substantially elevated    #CAD-follows with Dr. Alroy Aspen -cath in [place]  Dr. Oksana Bergamo 04/18/20 with stent #hyperlipidemia S: Medication:Aspirin  81 mg, metoprolol  12.5 mg extended release daily (also helps with history of palpitations), fish oil -myalgias on  rosuvastatin  20 mg daily, zetia  10 mg-he is going to hold off on these for now - Has nitroglycerin  on hand if needed -lpa high- sees Dr. Maximo Spar September 2025  - Symptoms: No chest pain reported other than some burning from reflux  A/P: CAD appears asymptomatic-seems to primarily be reflux related.  No shortness of breath reported.  No exertional pain.  We will continue aspirin  and metoprolol  as well as fish oil - Lipids likely elevated but I think we need to stay off statins for now at least until he feels better from a shoulder perspective-I also think he would likely be a better benefit to have a PCSK9 inhibitor with myalgias on statin and elevated lipoprotein a but we will defer to Dr. Candida Chalk expertise   # GERD S:Medication: omeprazole  20 mg, plus Pepcid in evening - has had some reflux lately. Out doing yard work no issues- no exertional element A/P: Generally well-controlled but some intermittent breakthrough-continue monitor #spot on chest- has noted a hyperpigmented spot about 1 x 1 cm on right upper chest in the last few weeks- brand new- we will refer to [facility] where his wife goes  Recommended follow up: Return for next already scheduled visit or sooner if needed. Future Appointments  Date Time Provider Department Center  01/13/2024  1:20 PM  Hazle Lites, MD [facility]  06/05/2024 11:00 AM Almira Jaeger, MD [facility]  08/07/2024  1:00 PM [facility] ANNUAL WELLNESS VISIT 1 [facility]    Lab/Order associations:   ICD-10-CM   1. Acute pain of both shoulders  M25.511 Ambulatory referral to Orthopedic Surgery   M25.512 Sedimentation rate    C-reactive protein    2. Coronary artery disease involving native coronary artery of native heart without angina pectoris  I25.10     3. 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 Skin lesion  L98.9 Ambulatory referral to Dermatology      No orders of the defined types were placed in this encounter.   Return precautions advised.  Clarisa Crooked, MD

## 2023-10-21 NOTE — Patient Instructions (Addendum)
 Call [facility] next week and let them know you have a referral in place from our office   Also call [facility]- I'm giving you print on both referrals  Stay off rosuvastatin  and Zetia  for now- reach out to me maybe in a month if feeling better and we can possibly retrial just rosuvastatin  and possibly lower dose  Recommended follow up: Return for next already scheduled visit or sooner if needed.

## 2023-10-25 DIAGNOSIS — M25512 Pain in left shoulder: Secondary | ICD-10-CM | POA: Diagnosis not present

## 2023-10-25 DIAGNOSIS — M25511 Pain in right shoulder: Secondary | ICD-10-CM | POA: Diagnosis not present

## 2023-11-07 DIAGNOSIS — M7552 Bursitis of left shoulder: Secondary | ICD-10-CM | POA: Diagnosis not present

## 2023-11-07 DIAGNOSIS — M7551 Bursitis of right shoulder: Secondary | ICD-10-CM | POA: Diagnosis not present

## 2023-11-07 DIAGNOSIS — M7542 Impingement syndrome of left shoulder: Secondary | ICD-10-CM | POA: Diagnosis not present

## 2023-11-07 DIAGNOSIS — M7541 Impingement syndrome of right shoulder: Secondary | ICD-10-CM | POA: Diagnosis not present

## 2023-11-09 DIAGNOSIS — M7552 Bursitis of left shoulder: Secondary | ICD-10-CM | POA: Diagnosis not present

## 2023-11-09 DIAGNOSIS — M7541 Impingement syndrome of right shoulder: Secondary | ICD-10-CM | POA: Diagnosis not present

## 2023-11-09 DIAGNOSIS — M7551 Bursitis of right shoulder: Secondary | ICD-10-CM | POA: Diagnosis not present

## 2023-11-09 DIAGNOSIS — M7542 Impingement syndrome of left shoulder: Secondary | ICD-10-CM | POA: Diagnosis not present

## 2023-11-15 DIAGNOSIS — M7552 Bursitis of left shoulder: Secondary | ICD-10-CM | POA: Diagnosis not present

## 2023-11-15 DIAGNOSIS — M7541 Impingement syndrome of right shoulder: Secondary | ICD-10-CM | POA: Diagnosis not present

## 2023-11-15 DIAGNOSIS — M7542 Impingement syndrome of left shoulder: Secondary | ICD-10-CM | POA: Diagnosis not present

## 2023-11-15 DIAGNOSIS — M7551 Bursitis of right shoulder: Secondary | ICD-10-CM | POA: Diagnosis not present

## 2023-11-17 DIAGNOSIS — M7552 Bursitis of left shoulder: Secondary | ICD-10-CM | POA: Diagnosis not present

## 2023-11-17 DIAGNOSIS — M7542 Impingement syndrome of left shoulder: Secondary | ICD-10-CM | POA: Diagnosis not present

## 2023-11-17 DIAGNOSIS — M7541 Impingement syndrome of right shoulder: Secondary | ICD-10-CM | POA: Diagnosis not present

## 2023-11-17 DIAGNOSIS — M7551 Bursitis of right shoulder: Secondary | ICD-10-CM | POA: Diagnosis not present

## 2023-11-22 DIAGNOSIS — D1801 Hemangioma of skin and subcutaneous tissue: Secondary | ICD-10-CM | POA: Diagnosis not present

## 2023-11-22 DIAGNOSIS — L814 Other melanin hyperpigmentation: Secondary | ICD-10-CM | POA: Diagnosis not present

## 2023-11-22 DIAGNOSIS — L821 Other seborrheic keratosis: Secondary | ICD-10-CM | POA: Diagnosis not present

## 2023-11-22 DIAGNOSIS — L218 Other seborrheic dermatitis: Secondary | ICD-10-CM | POA: Diagnosis not present

## 2023-11-22 DIAGNOSIS — D485 Neoplasm of uncertain behavior of skin: Secondary | ICD-10-CM | POA: Diagnosis not present

## 2023-11-22 DIAGNOSIS — L820 Inflamed seborrheic keratosis: Secondary | ICD-10-CM | POA: Diagnosis not present

## 2023-11-23 DIAGNOSIS — M7542 Impingement syndrome of left shoulder: Secondary | ICD-10-CM | POA: Diagnosis not present

## 2023-11-23 DIAGNOSIS — M7552 Bursitis of left shoulder: Secondary | ICD-10-CM | POA: Diagnosis not present

## 2023-11-23 DIAGNOSIS — M7541 Impingement syndrome of right shoulder: Secondary | ICD-10-CM | POA: Diagnosis not present

## 2023-11-23 DIAGNOSIS — M7551 Bursitis of right shoulder: Secondary | ICD-10-CM | POA: Diagnosis not present

## 2023-11-24 DIAGNOSIS — M25511 Pain in right shoulder: Secondary | ICD-10-CM | POA: Diagnosis not present

## 2023-12-01 ENCOUNTER — Encounter: Payer: Self-pay | Admitting: Family Medicine

## 2023-12-01 NOTE — Telephone Encounter (Signed)
 See below

## 2023-12-19 ENCOUNTER — Telehealth: Payer: Self-pay | Admitting: Pharmacy Technician

## 2023-12-19 ENCOUNTER — Encounter (HOSPITAL_BASED_OUTPATIENT_CLINIC_OR_DEPARTMENT_OTHER): Payer: Self-pay | Admitting: Internal Medicine

## 2023-12-19 ENCOUNTER — Other Ambulatory Visit (HOSPITAL_COMMUNITY): Payer: Self-pay

## 2023-12-19 ENCOUNTER — Ambulatory Visit (INDEPENDENT_AMBULATORY_CARE_PROVIDER_SITE_OTHER): Admitting: Internal Medicine

## 2023-12-19 VITALS — BP 114/68 | HR 70 | Ht 66.0 in | Wt 159.8 lb

## 2023-12-19 DIAGNOSIS — E785 Hyperlipidemia, unspecified: Secondary | ICD-10-CM | POA: Diagnosis not present

## 2023-12-19 DIAGNOSIS — E7841 Elevated Lipoprotein(a): Secondary | ICD-10-CM | POA: Diagnosis not present

## 2023-12-19 DIAGNOSIS — K080 Exfoliation of teeth due to systemic causes: Secondary | ICD-10-CM | POA: Diagnosis not present

## 2023-12-19 DIAGNOSIS — I2510 Atherosclerotic heart disease of native coronary artery without angina pectoris: Secondary | ICD-10-CM

## 2023-12-19 DIAGNOSIS — Z79899 Other long term (current) drug therapy: Secondary | ICD-10-CM

## 2023-12-19 DIAGNOSIS — T466X5A Adverse effect of antihyperlipidemic and antiarteriosclerotic drugs, initial encounter: Secondary | ICD-10-CM

## 2023-12-19 DIAGNOSIS — M791 Myalgia, unspecified site: Secondary | ICD-10-CM

## 2023-12-19 DIAGNOSIS — T466X5D Adverse effect of antihyperlipidemic and antiarteriosclerotic drugs, subsequent encounter: Secondary | ICD-10-CM

## 2023-12-19 MED ORDER — REPATHA SURECLICK 140 MG/ML ~~LOC~~ SOAJ: 140.0000 mg | SUBCUTANEOUS | 2 refills | Status: DC

## 2023-12-19 NOTE — Progress Notes (Unsigned)
 LIPID CLINIC CONSULT NOTE  Chief Complaint:  Manage dyslipidemia  Primary Care Physician: Brett Garnette KIDD, MD  Primary Cardiologist:  Brett Passe, MD (Inactive)  HPI:  Brett Ross is a 78 y.o. male who is being seen today for the evaluation of dyslipidemia at the request of Brett Garnette KIDD, MD. This is a pleasant 78 year old male, referred for evaluation management of dyslipidemia.  Brett Ross has a history of coronary artery disease and high cholesterol but has not been able to tolerate statins including atorvastatin , rosuvastatin  and ezetimibe  all of which caused leg pain and cramping side effects.  He had a coronary CT scan performed in 2021 which showed a calcium  score of 476, 69th percentile with moderate to severe stenosis of the ostial diagonal branch.  FFR at that time was negative.  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  He is currently not on any lipid-lowering therapy.  His last lipid profile in November 2024 showed total cholesterol 138, HDL 69, triglycerides 88 and LDL 52 however he was on lipid-lowering therapy at that time.  PMHx:  Past Medical History:  Diagnosis Date   Allergy    Arthritis    CAD (coronary artery disease) 2021   Cataract    bilateral cateracts removed with implants bilateraly   Detached retina    L 04/2014  Right 1990   Environmental allergies    GERD (gastroesophageal reflux disease)    HYPERLIPIDEMIA 02/06/2007   Macular pigment epithelial tear    bilateral eyes    Past Surgical History:  Procedure Laterality Date   cataract surgery     01/09/15 left. right 2018.    COLONOSCOPY     CORONARY ANGIOPLASTY WITH STENT PLACEMENT  04/2020   INGUINAL HERNIA REPAIR     bilateral   KNEE ARTHROSCOPY  2007   left   POLYPECTOMY     VITRECTOMY     06/2014 left   VITRECTOMY     10/2017    FAMHx:  Family History  Problem Relation Age of Onset   Heart failure Mother        CHF and heart disease; had  breast cancer in 24s   Breast cancer Mother    Leukemia Father    Melanoma Brother    Heart disease Brother 16       mi, stent   Heart disease Brother 37       MI-required stent   Colon cancer Neg Hx    Rectal cancer Neg Hx    Stomach cancer Neg Hx    Esophageal cancer Neg Hx     SOCHx:   reports that he has never smoked. He has never used smokeless tobacco. He reports current alcohol use of about 7.0 standard drinks of alcohol per week. He reports that he does not use drugs.  ALLERGIES:  Allergies  Allergen Reactions   Caffeine     REACTION: tinnitis   Molds & Smuts     Pt does not remember reaction   Other     [place]  [employer]  Reaction causes sneezing and nasal congerstion   Rosuvastatin  Other (See Comments)    Muscle aches   Sulfites     Per patient he has difficulty sleeping   Adhesive [Tape] Rash    ROS: Pertinent items noted in HPI and remainder of comprehensive ROS otherwise negative.  HOME MEDS: Current Outpatient Medications on File Prior to Visit  Medication Sig  Dispense Refill   aspirin  EC 81 MG tablet Take 1 tablet (81 mg total) by mouth daily. Swallow whole. 90 tablet 3   famotidine (PEPCID) 20 MG tablet Take by mouth at bedtime.     metoprolol  succinate (TOPROL -XL) 25 MG 24 hr tablet Take 0.5 tablets (12.5 mg total) by mouth daily. 45 tablet 1   Multiple Vitamins-Minerals (CENTRUM SILVER PO) Take by mouth daily.     nitroGLYCERIN  (NITROSTAT ) 0.4 MG SL tablet Place 1 tablet (0.4 mg total) under the tongue every 5 (five) minutes as needed for chest pain. 25 tablet 3   Omega-3 Fatty Acids (SALMON OIL-1000) 200 MG CAPS Take 2,000 mg by mouth daily.     omeprazole  (PRILOSEC) 20 MG capsule Take 1 capsule (20 mg total) by mouth daily. 90 capsule 3   cetirizine (ZYRTEC) 10 MG tablet Take 10 mg by mouth at bedtime. (Patient not taking: Reported on 12/19/2023)     clobetasol  (TEMOVATE ) 0.05 % external solution  (Patient not taking: Reported on  12/19/2023)     No current facility-administered medications on file prior to visit.    LABS/IMAGING: No results found for this or any previous visit (from the past 48 hours). No results found.  LIPID PANEL:    Component Value Date/Time   CHOL 138 03/18/2023 0824   TRIG 88 03/18/2023 0824   HDL 69 03/18/2023 0824   CHOLHDL 2.0 03/18/2023 0824   CHOLHDL 2 02/15/2022 1048   VLDL 31.4 02/15/2022 1048   LDLCALC 52 03/18/2023 0824   LDLCALC 70 12/21/2019 1356    Lipoprotein (a)  Date/Time Value Ref Range Status  09/09/2023 08:41 AM 215 (H) <75 nmol/L Final    Comment:    . Risk Category   Optimal        < 75 nmol/L   Moderate   75 - 125 nmol/L   High          > 125 nmol/L . Cardiovascular event risk category cut points (optimal, moderate, high) are based on Tsimika S. JACC 2017;69:692-711. .      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   Brett Ross has a dyslipidemia with a target LDL less than 70 although he was well-controlled recently on lab work he has stopped his statin due to intolerance and cannot take ezetimibe .  He has been intolerant to to high intensity statins and has a high LP(a).  He will likely need a PCSK9 inhibitor.  He might need updated labs prior to that.  We will reach out for prior authorization for Repatha .  Plan then to repeat lipids in about 3 to 4 months and an LP(a) as well.  Brett KYM Maxcy, MD, [facility], FNLA, FACP  [place]  [facility]  Medical Director of  the [facility] Lipid Disorders &  Cardiovascular Risk Reduction Clinic Diplomate of the American Board of Clinical Lipidology Attending Cardiologist  Direct Dial: [phone]  Fax: [phone]  Website:  [email or web address] Brett Ross Mona 12/19/2023, 9:31 AM

## 2023-12-19 NOTE — Patient Instructions (Signed)
 Medication Instructions:   Dr. Mona recommends Repatha  (PCSK9). This is an injectable cholesterol medication self-administered. This medication will likely need prior approval with your insurance company, which we will work on. If the medication is not approved initially, we may need to do an appeal with your insurance.   Administer medication in area of fatty tissue such as abdomen, outer thigh, back of upper arm - and rotate site with each injection Store medication in refrigerator until ready to administer - allow to sit at room temp for 30 mins - 1 hour prior to injection Dispose of medication in a SHARPS container - your pharmacy should be able to direct you on this and proper disposal   If you need a co-pay card for Repatha : https://www.repatha .com/repatha -cost If you need a co-pay card for Praluent: [email or web address]  Patient Assistance:    These foundations have funds at various times.   The PAN Foundation: https://www.panfoundation.org/disease-funds/hypercholesterolemia/ -- can sign up for wait list  The [facility] offers assistance to help pay for medication copays.  They will cover copays for all cholesterol lowering meds, including statins, fibrates, omega-3 fish oils like Vascepa, ezetimibe , Repatha , Praluent, Nexletol, Nexlizet.  The cards are usually good for $2,500 or 12 months, whichever comes first. Our fax # is [phone] (you will need this to apply) Go to healthwellfoundation.org Click on "Apply Now" Answer questions as to whom is applying (patient or representative) Your disease fund will be "hypercholesterolemia - Medicare access" They will ask questions about finances and which medications you are taking for cholesterol When you submit, the approval is usually within minutes.  You will need to print the card information from the site You will need to show this information to your pharmacy, they will bill your Medicare Part D  plan first -then bill Health Well --for the copay.   You can also call them at [phone], although the hold times can be quite long.     *If you need a refill on your cardiac medications before your next appointment, please call your pharmacy*   Lab Work:  IN 3 MONTHS HERE AT [facility] ON 3rd FLOOR (PRIMARY CARE [address])--NMR LIPOPROFILE AND LIPOPROTEIN A--PLEASE COME FASTING TO THIS LAB APPOINTMENT  If you have labs (blood work) drawn today and your tests are completely normal, you will receive your results only by: MyChart Message (if you have MyChart) OR A paper copy in the mail If you have any lab test that is abnormal or we need to change your treatment, we will call you to review the results.    Follow-Up:  AS NEEDED  Other Instructions  The Healthwell Foundation offers assistance to help pay for medication copays.  They will cover copays for all cholesterol lowering meds, including statins, fibrates, omega-3 fish oils like Vascepa, ezetimibe , Repatha , Praluent, Nexletol, Nexlizet.  The cards are usually good for $2,500 or 12 months, whichever comes first. Our fax # is [phone] (you will need this to apply) Go to healthwellfoundation.org Click on "Apply Now" Answer questions as to whom is applying (patient or representative) Your disease fund will be "hypercholesterolemia - Medicare access" They will ask questions about finances and which medications you are taking for cholesterol When you submit, the approval is usually within minutes.  You will need to print the card information from the site You will need to show this information to your pharmacy, they will bill your Medicare Part D plan first -then bill Health Well --for the copay.   You can also  call them at [phone], although the hold times can be quite long.

## 2023-12-19 NOTE — Telephone Encounter (Signed)
 Pharmacy Patient Advocate Encounter  Received notification from [facility] medicare that Prior Authorization for repatha  has been APPROVED from 12/19/23 to 12/18/24. Ran test claim, Copay is $45.00- one month. This test claim was processed through [facility]- copay amounts may vary at other pharmacies due to pharmacy/plan contracts, or as the patient moves through the different stages of their insurance plan.   PA #/Case ID/Reference #: [id number]

## 2023-12-19 NOTE — Addendum Note (Signed)
 Addended by: GLADIS PORTER HERO on: 12/19/2023 01:51 PM   Modules accepted: Orders

## 2023-12-19 NOTE — Telephone Encounter (Signed)
 Pharmacy Patient Advocate Encounter   Received notification from Physician's Office that prior authorization for Repatha  is required/requested.   Insurance verification completed.   The patient is insured through [employer].   Per test claim: PA required; PA submitted to above mentioned insurance via Latent Key/confirmation #/EOC [facility] Status is pending

## 2023-12-19 NOTE — Telephone Encounter (Signed)
 Returned a call back to the pt about approved Repatha .  Pt request a month supply [facility] [place] then 90 day through mail order when he calls back to request this.   [facility]   Pt verbalized understanding and agrees with this plan.

## 2023-12-30 ENCOUNTER — Encounter (HOSPITAL_BASED_OUTPATIENT_CLINIC_OR_DEPARTMENT_OTHER): Payer: Self-pay | Admitting: Internal Medicine

## 2023-12-30 ENCOUNTER — Other Ambulatory Visit: Payer: Self-pay

## 2023-12-30 MED ORDER — METOPROLOL SUCCINATE ER 25 MG PO TB24: 12.5000 mg | ORAL_TABLET | Freq: Every day | ORAL | 3 refills | Status: DC

## 2024-01-05 DIAGNOSIS — M542 Cervicalgia: Secondary | ICD-10-CM | POA: Diagnosis not present

## 2024-01-10 ENCOUNTER — Encounter (HOSPITAL_BASED_OUTPATIENT_CLINIC_OR_DEPARTMENT_OTHER): Payer: Self-pay | Admitting: Internal Medicine

## 2024-01-11 DIAGNOSIS — M542 Cervicalgia: Secondary | ICD-10-CM | POA: Diagnosis not present

## 2024-01-13 ENCOUNTER — Institutional Professional Consult (permissible substitution) (HOSPITAL_BASED_OUTPATIENT_CLINIC_OR_DEPARTMENT_OTHER): Admitting: Internal Medicine

## 2024-01-24 ENCOUNTER — Ambulatory Visit: Payer: Self-pay

## 2024-01-24 DIAGNOSIS — M542 Cervicalgia: Secondary | ICD-10-CM | POA: Diagnosis not present

## 2024-01-24 NOTE — Telephone Encounter (Signed)
 FYI Only or Action Required?: FYI only for provider.  Patient was last seen in primary care on 10/21/2023 by Katrinka Garnette KIDD, MD.  Called Nurse Triage reporting Generalized Body Aches.  Symptoms began several months ago.  Symptoms are: gradually worsening.  Triage Disposition: See PCP When Office is Open (Within 3 Days)  Patient/caregiver understands and will follow disposition?: Yes       Copied from CRM #[id number]. Topic: Clinical - Red Word Triage >> Jan 24, 2024 10:12 AM Charolett L wrote: [facility] that prompted transfer to Nurse Triage: significant Joint and muscle pain, blood 158/91        Reason for Disposition  [1] MODERATE pain (e.g., interferes with normal activities) AND [2] present > 3 days  Answer Assessment - Initial Assessment Questions 1. ONSET: When did the muscle aches or body pains start?      Intermittent for months  2. LOCATION: What part of your body is hurting? (e.g., entire body, arms, legs)      Diffuse  3. SEVERITY: How bad is the pain? (Scale 1-10; or mild, moderate, severe)     Moderate  4. CAUSE: What do you think is causing the pains?     Unsure  5. FEVER: Do you have a fever? If Yes, ask: What is your temperature, how was it measured, and  when did it start?      No 6. OTHER SYMPTOMS: Do you have any other symptoms? (e.g., chest pain, cold or flu symptoms, rash, weakness, weight loss)     Elevated blood pressure for a couple of weeks (158/91 this morning)  Protocols used: Muscle Aches and Body Pain-A-AH

## 2024-01-25 ENCOUNTER — Ambulatory Visit (INDEPENDENT_AMBULATORY_CARE_PROVIDER_SITE_OTHER): Admitting: Family Medicine

## 2024-01-25 ENCOUNTER — Ambulatory Visit: Payer: Self-pay | Admitting: Family Medicine

## 2024-01-25 ENCOUNTER — Encounter: Payer: Self-pay | Admitting: Family Medicine

## 2024-01-25 VITALS — BP 100/62 | HR 75 | Temp 97.7°F | Ht 66.0 in | Wt 156.2 lb

## 2024-01-25 DIAGNOSIS — Z23 Encounter for immunization: Secondary | ICD-10-CM

## 2024-01-25 DIAGNOSIS — M791 Myalgia, unspecified site: Secondary | ICD-10-CM | POA: Diagnosis not present

## 2024-01-25 DIAGNOSIS — M2550 Pain in unspecified joint: Secondary | ICD-10-CM | POA: Diagnosis not present

## 2024-01-25 LAB — CBC WITH DIFFERENTIAL/PLATELET
Basophils Absolute: 0 K/uL (ref 0.0–0.1)
Basophils Relative: 0.4 % (ref 0.0–3.0)
Eosinophils Absolute: 0.1 K/uL (ref 0.0–0.7)
Eosinophils Relative: 1.1 % (ref 0.0–5.0)
HCT: 43 % (ref 39.0–52.0)
Hemoglobin: 14.8 g/dL (ref 13.0–17.0)
Lymphocytes Relative: 25 % (ref 12.0–46.0)
Lymphs Abs: 2.2 K/uL (ref 0.7–4.0)
MCHC: 34.3 g/dL (ref 30.0–36.0)
MCV: 91.2 fl (ref 78.0–100.0)
Monocytes Absolute: 0.8 K/uL (ref 0.1–1.0)
Monocytes Relative: 9.4 % (ref 3.0–12.0)
Neutro Abs: 5.6 K/uL (ref 1.4–7.7)
Neutrophils Relative %: 64.1 % (ref 43.0–77.0)
Platelets: 306 K/uL (ref 150.0–400.0)
RBC: 4.72 Mil/uL (ref 4.22–5.81)
RDW: 13.4 % (ref 11.5–15.5)
WBC: 8.8 K/uL (ref 4.0–10.5)

## 2024-01-25 LAB — COMPREHENSIVE METABOLIC PANEL WITH GFR
ALT: 11 U/L (ref 0–53)
AST: 15 U/L (ref 0–37)
Albumin: 4.3 g/dL (ref 3.5–5.2)
Alkaline Phosphatase: 43 U/L (ref 39–117)
BUN: 12 mg/dL (ref 6–23)
CO2: 27 meq/L (ref 19–32)
Calcium: 9.6 mg/dL (ref 8.4–10.5)
Chloride: 104 meq/L (ref 96–112)
Creatinine, Ser: 0.86 mg/dL (ref 0.40–1.50)
GFR: 84.1 mL/min (ref >60.00)
Glucose, Bld: 93 mg/dL (ref 70–99)
Potassium: 4.2 meq/L (ref 3.5–5.1)
Sodium: 140 meq/L (ref 135–145)
Total Bilirubin: 0.9 mg/dL (ref 0.2–1.2)
Total Protein: 6.7 g/dL (ref 6.0–8.3)

## 2024-01-25 LAB — TSH: TSH: 1.33 u[IU]/mL (ref 0.35–5.50)

## 2024-01-25 LAB — SEDIMENTATION RATE: Sed Rate: 17 mm/h (ref 0–20)

## 2024-01-25 LAB — VITAMIN D 25 HYDROXY (VIT D DEFICIENCY, FRACTURES): VITD: 22.98 ng/mL — ABNORMAL LOW (ref 30.00–100.00)

## 2024-01-25 LAB — CK: Total CK: 46 U/L (ref 17–232)

## 2024-01-25 LAB — C-REACTIVE PROTEIN: CRP: <1 mg/dL (ref 0.5–20.0)

## 2024-01-25 MED ORDER — TRAMADOL HCL 50 MG PO TABS: 50.0000 mg | ORAL_TABLET | Freq: Four times a day (QID) | ORAL | 0 refills | Status: DC | PRN

## 2024-01-25 MED ORDER — PREDNISONE 20 MG PO TABS: 20.0000 mg | ORAL_TABLET | Freq: Every day | ORAL | 0 refills | Status: DC

## 2024-01-25 NOTE — Patient Instructions (Signed)
 Please follow up next week as scheduled.  I will release your lab results to you on your MyChart account with further instructions. You may see the results before I do, but when I review them I will send you a message with my report or have my assistant call you if things need to be discussed. Please reply to my message with any questions. Thank you!   If you have any questions or concerns, please don't hesitate to send me a message via MyChart or call the office at [phone]. Thank you for visiting with us  today! It's our pleasure caring for you.    VISIT SUMMARY: Today, we discussed your severe muscle and joint pain, numbness and tingling in your right hand and arm, and elevated blood pressure. We reviewed your MRI results and medication history to better understand your symptoms and plan the next steps for your treatment.  YOUR PLAN: -CERVICAL RADICULOPATHY WITH RIGHT ARM AND HAND PAIN AND NUMBNESS: Cervical radiculopathy is a condition where nerves in the neck are compressed or irritated, causing pain, numbness, and tingling in the arm and hand. You have an appointment with a specialist on October 6 to evaluate the possibility of a nerve block to relieve your symptoms.  -possible PMR: we are starting a trial of oral prednisone  to see if it helps with your symptoms and to help diagnose polymyalgia rheumatica, a condition that causes muscle pain and stiffness.  -HYPERTENSION: Hypertension is high blood pressure. Your recent blood pressure readings have been elevated, particularly in the morning. We will continue to monitor your blood pressure to determine if further treatment is needed. Your blood pressure was low normal in the office today. Pain and stress can cause elevated blood pressures.   INSTRUCTIONS: Please follow up with the specialist on October 6 for your cervical radiculopathy. Additionally, monitor your blood pressure regularly and report any significant changes. If you start the  trial of oral prednisone , take it as directed and report any side effects or changes in your symptoms.                      Contains text generated by Abridge.                                 Contains text generated by Abridge.

## 2024-01-25 NOTE — Progress Notes (Signed)
 See mychart note Dear Mr. Hellstrom, Your lab results remain all stable although your Vit D level is just below normal. You can consider adding a daily vit D otc supplement to your medications although I do not believe this level will cause severe pain; it can contribute to muscle pain though.   Let me know how you do with the prednisone  or tramadil.  Sincerely, Dr. Jodie

## 2024-01-25 NOTE — Progress Notes (Signed)
 Subjective  CC:  Chief Complaint  Patient presents with   body pain    HPI: Brett Ross is a 78 y.o. male who presents to the office today to address the problems listed above in the chief complaint. Discussed the use of AI scribe software for clinical note transcription with the patient, who gave verbal consent to proceed.  Same day acute visit; PCP not available. New pt to me. Chart reviewed.   History of Present Illness Brett Ross is a 78 year old male who presents with severe muscle and joint pain.  Myalgias and arthralgias - Severe muscle cramping and tearing sensation since January or February - Pain described as similar to the worst case of flu, with joint pain and muscle aches - Pain is severe and limits mobility, - Pain is daily but inconsistent, affecting shoulders, pelvis, and wrists but also thighs and low back - Raising arms over head is particularly painful - Getting up is painful - Wrists are very sore, making it difficult to push up from a seated position - Reviewed notes from primary care and orthopedics. - Has known osteoarthritis, cervical radiculopathy, shoulder arthritis, hand arthritis and lumbar arthritis.  Orthopedics has done MRIs.  It sounds like he has an appointment with neurosurgery for treatment options. - Steroid injections to the shoulders and hand have been helpful. - Workup for PMR including lab work, sed rate, CK, CRP have been negative. - Myalgias related to statin have dissipated but only slightly.  He is now on Repatha .  Cervical radiculopathy by MRI - Numbness, pain, and tingling in right hand and arm - Symptoms disrupt sleep  Shoulder and spinal pathology - MRI of shoulder revealed bone spurs and bulging discs - History of arthritic lower spine - Received corticosteroid injections in shoulders a few months ago with significant relief  Pain management - Prescribed muscle relaxer but forgot to take it - Uses  Tylenol with minimal relief - Not prescribed tramadol  or NSAIDs due to cardiac concerns  Hypertension - Blood pressure elevated since starting Repatha  two months ago - Readings range from 140-158/91 mmHg, particularly in the morning - Uncertain if elevated blood pressure is related to pain  Medication history and adverse effects - Initially attributed muscle pain to rosuvastatin  use; pain persisted after discontinuation - Started Repatha  approximately two months ago   Assessment  1. Arthralgia, unspecified joint   2. Need for influenza vaccination   3. 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 - Proceed with specialist appointment on October 6 for evaluation of potential nerve block. Consider epidural steroid injections, gabapentin  Suspected PMR vs pain due to multifactorial etiologies: OA, cervical radiculopathy Severe, intermittent muscle pain and cramping persist despite discontinuation of rosuvastatin . Symptoms include flu-like joint pain and muscle aches, affecting daily activities. Differential diagnosis includes polymyalgia rheumatica (PMR) despite normal blood markers.  - trial of pred 20mg  daily to aid in diagnosis of polymyalgia rheumatica. IF quick and effective pain relief, likely dx. If not, then would start pain management with tramadol  and f/u with ortho/neurosurg and PCP. Patient understands and agrees with care plan.  - check lab work to help clarify etiologies  Hypertension Recent episodes of elevated blood pressure with systolic values  ranging from 140 to 158 mmHg and diastolic values around 91 mmHg, primarily in the morning. Likely pain related. Normotensive in office today. Also could be stress related. 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  Pt received flu shot today   Follow up: Monday with PCP Orders Placed This Encounter  Procedures   Flu vaccine HIGH DOSE PF(Fluzone Trivalent)   CBC with Differential/Platelet   CK   Comprehensive metabolic panel with GFR   TSH   VITAMIN D  25 Hydroxy (Vit-D Deficiency, Fractures)   Sedimentation rate   C-reactive protein   Meds ordered this encounter  Medications   predniSONE  (DELTASONE ) 20 MG tablet    Sig: Take 1 tablet (20 mg total) by mouth daily with breakfast.    Dispense:  10 tablet    Refill:  0   traMADol  (ULTRAM ) 50 MG tablet    Sig: Take 1 tablet (50 mg total) by mouth every 6 (six) hours as needed for moderate pain (pain score 4-6).    Dispense:  30 tablet    Refill:  0     I reviewed the patients updated PMH, FH, and SocHx.  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 Swallow whole.   cetirizine (ZYRTEC) 10 MG tablet Take 10 mg by mouth at bedtime.   clobetasol  (TEMOVATE ) 0.05 % external solution    Evolocumab  (REPATHA  SURECLICK) 140 MG/ML SOAJ Inject 140 mg into the skin every 14 (fourteen) days.   famotidine (PEPCID) 20 MG tablet Take by mouth at bedtime.   metoprolol  succinate (TOPROL -XL) 25 MG 24 hr tablet Take 0.5 tablets (12.5 mg total) by mouth daily.   Multiple Vitamins-Minerals (CENTRUM SILVER PO) Take by mouth daily.   nitroGLYCERIN  (NITROSTAT ) 0.4 MG SL tablet Place 1 tablet (0.4 mg total) under the tongue every 5 (five) minutes as needed for chest pain.   Omega-3 Fatty Acids (SALMON OIL-1000) 200 MG CAPS Take 2,000 mg by mouth daily.   omeprazole  (PRILOSEC) 20 MG  capsule Take 1 capsule (20 mg total) by mouth daily.   predniSONE  (DELTASONE ) 20 MG tablet Take 1 tablet (20 mg total) by mouth daily with breakfast.   traMADol  (ULTRAM ) 50 MG tablet Take 1 tablet (50 mg total) by mouth every 6 (six) hours as needed for moderate pain (pain score 4-6).   Allergies: Patient is allergic to caffeine, molds & smuts, other, rosuvastatin , sulfites, and adhesive [tape]. Family History: Patient family history includes Breast cancer in his mother; Heart disease (age of onset: 50) in his brother; Heart disease (age of onset: 43) in his brother; Heart failure in his mother; Leukemia in his father; Melanoma in his brother. Social History:  Patient  reports that he has never smoked. He has never used smokeless tobacco. He reports current alcohol use of about 7.0 standard drinks of alcohol per week. He reports that he does not use drugs.  Review of Systems: Constitutional: Negative for fever malaise or anorexia Cardiovascular: negative for chest pain Respiratory: negative for SOB or persistent cough Gastrointestinal: negative for abdominal pain  Objective  Vitals: BP 100/62   Pulse 75  Temp 97.7 F (36.5 C)   Ht 5' 6 (1.676 m)   Wt 156 lb 3.2 oz (70.9 kg)   SpO2 98%   BMI 25.21 kg/m  General: no acute distress , A&Ox3, appears tired Psych: appears stressed; tearful in office HEENT: PEERL, conjunctiva normal, neck is supple  Commons side effects, risks, benefits, and alternatives for medications and treatment plan prescribed today were discussed, and the patient expressed understanding of the given instructions. Patient is instructed to call or message via MyChart if he/she has any questions or concerns regarding our treatment plan. No barriers to understanding were identified. We discussed Red Flag symptoms and signs in detail. Patient expressed understanding regarding what to do in case of urgent or emergency type symptoms.  Medication list was reconciled,  printed and provided to the patient in AVS. Patient instructions and summary information was reviewed with the patient as documented in the AVS. This note was prepared with assistance of Dragon voice recognition software. Occasional wrong-word or sound-a-like substitutions may have occurred due to the inherent limitations of voice recognition software

## 2024-01-30 ENCOUNTER — Ambulatory Visit (INDEPENDENT_AMBULATORY_CARE_PROVIDER_SITE_OTHER): Admitting: Family Medicine

## 2024-01-30 ENCOUNTER — Encounter: Payer: Self-pay | Admitting: Family Medicine

## 2024-01-30 VITALS — BP 118/64 | HR 68 | Temp 97.3°F | Ht 66.0 in | Wt 157.6 lb

## 2024-01-30 DIAGNOSIS — M791 Myalgia, unspecified site: Secondary | ICD-10-CM

## 2024-01-30 DIAGNOSIS — M2560 Stiffness of unspecified joint, not elsewhere classified: Secondary | ICD-10-CM | POA: Diagnosis not present

## 2024-01-30 DIAGNOSIS — M25511 Pain in right shoulder: Secondary | ICD-10-CM

## 2024-01-30 DIAGNOSIS — E7841 Elevated Lipoprotein(a): Secondary | ICD-10-CM

## 2024-01-30 DIAGNOSIS — E785 Hyperlipidemia, unspecified: Secondary | ICD-10-CM

## 2024-01-30 DIAGNOSIS — M25512 Pain in left shoulder: Secondary | ICD-10-CM

## 2024-01-30 DIAGNOSIS — R002 Palpitations: Secondary | ICD-10-CM

## 2024-01-30 DIAGNOSIS — M2550 Pain in unspecified joint: Secondary | ICD-10-CM

## 2024-01-30 NOTE — Patient Instructions (Addendum)
 We have placed a referral for you today to Dr. Curt rheumatology- please call their # if you do not hear within a week.   (P) [phone] (E) [email or web address] (A) [address], [place], [place], [address]  If you could schedule lab for tomorrow or next day  We have placed a referral for you today to cardiology for echocardiogram- please call their # if you do not hear within a week (may be listed below or you may see mychart message within a few days with #).    Recommended follow up: Return in about 2 weeks (around 02/13/2024) for followup or sooner if needed.Schedule b4 you leave. To 4 weeks or let me know at any point if symptoms return off the prednisone - we may need to simply do a prolonged taper while waiting to get you into rheumatology

## 2024-01-30 NOTE — Progress Notes (Signed)
 Phone [phone] In person visit   Subjective:   Brett Ross is a 78 y.o. year old very pleasant male patient who presents for/with See problem oriented charting Chief Complaint  Patient presents with   Joint Pain    Was seen by Dr. Jodie 01/25/2024.    Discussed the use of AI scribe software for clinical note transcription with the patient, who gave verbal consent to proceed.  History of Present Illness   Brett Ross is a 78 year old male with osteoarthritis of multiple joints who presents with severe pain and stiffness.  He has been experiencing ongoing myalgias and arthralgias since January or February, described as similar to the worst case of flu in his life at times particularly with this most recent flare, with severe and disabling pain at times during this course. Morning stiffness and worsening fatigue are present. Bilateral shoulder pain makes it difficult to raise his arms over his head, and soreness in his wrists complicates tasks like pushing up from a seated position.  He often even has difficulty getting out of bed.  We have had multiple visits including May 9 and June 20 and he most recently saw Dr. Jodie on January 25, 2024  He was previously on statins and Zetia , which were discontinued due to concerns they might be contributing to his symptoms. He is now on Repatha . Despite stopping the statins, his symptoms persisted, though there was slight improvement initially.   He has attempted to work with orthopedics to find solutions.  They he has a history of osteoarthritis, cervical arthritis, radiculopathy, shoulder arthritis, hand arthritis, and lumbar spine arthritis.  Per report though I do not have direct access-previous MRIs have shown cervical radiculopathy with numbness, pain, and tingling in the right arm and hand, as well as bone spurs and bulging discs in the shoulder. He has received steroid injections to the shoulders and hand, which have been  somewhat helpful.  He recently  trialed prednisone  at 20 mg daily for ten days, and he reported that his pain and stiffness improved from a 'nine' to an 'uncomfortable one or two'.  He actually only tolerated 10 mg at a time outside of the first day due to poor sleep but he has done very well with this dose.  Also some stomach upset on 20 mg.  He experiences heart palpitations and a sensation of his heart pounding intermittently, though his blood pressure has been more stable recently. He continues to take metoprolol . His vitamin D  levels were noted to be low.   He has a history of a full stress test on a treadmill [place], but no recent echocardiogram. His symptoms have been debilitating, impacting his ability to perform daily activities.     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 Swallow whole. 90 tablet 3   cetirizine (  ZYRTEC) 10 MG tablet Take 10 mg by mouth at bedtime.     clobetasol  (TEMOVATE ) 0.05 % external solution      Evolocumab  (REPATHA  SURECLICK) 140 MG/ML SOAJ Inject 140 mg into the skin every 14 (fourteen) days. 2 mL 2   famotidine (PEPCID) 20 MG tablet Take by mouth at bedtime.     metoprolol  succinate (TOPROL -XL) 25 MG 24 hr tablet Take 0.5 tablets (12.5 mg total) by mouth daily. 45 tablet 3   Multiple Vitamins-Minerals (CENTRUM SILVER PO) Take by mouth daily.     nitroGLYCERIN  (NITROSTAT )  0.4 MG SL tablet Place 1 tablet (0.4 mg total) under the tongue every 5 (five) minutes as needed for chest pain. 25 tablet 3   Omega-3 Fatty Acids (SALMON OIL-1000) 200 MG CAPS Take 2,000 mg by mouth daily.     omeprazole  (PRILOSEC) 20 MG capsule Take 1 capsule (20 mg total) by mouth daily. 90 capsule 3   predniSONE  (DELTASONE ) 20 MG tablet Take 1 tablet (20 mg total) by mouth daily with breakfast. 10 tablet 0   traMADol  (ULTRAM ) 50 MG tablet Take 1 tablet (50 mg total) by mouth every 6 (six) hours as needed for moderate pain (pain score 4-6). 30 tablet 0   No current facility-administered medications for this visit.     Objective:  BP 118/64 (BP Location: Left Arm, Patient Position: Sitting, Cuff Size: Normal)   Pulse 68   Temp (!) 97.3 F (36.3 C) (Temporal)   Ht 5' 6 (1.676 m)   Wt 157 lb 9.6 oz (71.5 kg)   SpO2 95%   BMI 25.44 kg/m  Gen: NAD, resting comfortably CV: RRR no murmurs rubs or gallops Lungs: CTAB no crackles, wheeze, rhonchi Ext: no edema Skin: warm, dry Neuro: good strength in lower extremities and can easily lift hands over head but reports good day   LABS CRP: Not elevated (10/21/2023 and 01/25/2024) ESR: Not elevated (10/21/2023 and 01/25/2024) CK: not elevated (10/21/2023 and 01/25/2024)    Assessment and Plan    #Severe arthralgias and myalgias with morning stiffness Chronic severe arthralgias and myalgias with morning stiffness, initially suspected polymyalgia rheumatica (PMR) due to symptom pattern but Negative ESR, CRP, and CK results x 2 months apart during flares. In last week,  Significant improvement with prednisone , suggesting possible PMR despite atypical lab results or other autoimmune condition. Differential includes seronegative autoimmune conditions and statin-induced necrotizing autoimmune myopathy (but doubt). Vitamin D  deficiency noted but unlikely sole cause. Prednisone  reduced pain from 9/10 to 1-2/10. - Continue prednisone  taper, reducing  dose to half tablet every other day for last two pill which will last close to 20 days with original 10 tablets of 20 mg - Order autoimmune labs including ANA, rheumatoid factor, anti-CCP. - Refer to rheumatology, urgent referral to Dr. Curt at [facility] for his expertise. Also considered neurology- muscle biopsy? - Schedule lab visit for autoimmune testing for tomorrow (lab was closed during our visit) - Monitor symptoms post-prednisone  taper; if symptoms return, consider prolonged taper while awaiting rheumatology consultation.  #Cervical radiculopathy with right arm and hand symptoms Cervical radiculopathy with numbness, pain, and tingling in right arm and hand, disrupting sleep. MRI showed cervical issues including bone spurs and bulging discs. Prednisone  has reduced symptom substantially - Discuss potential epidural block with Dr. Ibazebo but with improvement likely to hold off - Monitor symptoms and consider further intervention if symptoms persist.  #Osteoarthritis of multiple sites (cervical, lumbar, shoulder, hand) Osteoarthritis reportedly noted by orthopedics in  cervical, lumbar, shoulder, and hand regions. Steroid injections have provided some relief. - Follow up with orthopedics as needed.  #Palpitations- does have history of this in past but more recurrent lately Intermittent palpitations with heart pounding sensation, TSH normal- need to rule out structural heart issues.  - Order echocardiogram to assess structural heart issues. prior  out of state stress testing not available and did not include echocardiogram per report - Consider cardiac monitor if palpitations persist.he declined for now   #elevated blood pressure reading- but mainly on metoprolol  for CAD Hypertension with recent readings of 130s/70s for most part but some elevations to 140's and even 160 once. . Currently managed with metoprolol  - Continue metoprolol  12.5 mg XR- could take second tablet  if needed - Monitor blood pressure regularly. - Consider increasing metoprolol  dose if blood pressure remains elevated.  #Hyperlipidemia, on Repatha  with elevated LPA in patient with known CAD Hyperlipidemia managed with Repatha  following statin discontinuation due to muscle symptoms that improved some before later worsening. Efficacy of Repatha  in controlling cholesterol levels is uncertain. - Continue Repatha  140 mg q2 weeks - Monitor cholesterol levels to assess efficacy.      Recommended follow up: Return in about 2 weeks (around 02/13/2024) for followup or sooner if needed.Schedule b4 you leave. Future Appointments  Date Time Provider Department Center  01/31/2024  9:30 AM [facility]  02/13/2024  9:20 AM Katrinka Garnette KIDD, MD [facility] [place]  03/19/2024  8:45 AM Delford Maude BROCKS, MD [facility]  06/05/2024 11:00 AM Katrinka Garnette KIDD, MD [facility]  08/07/2024  1:00 PM [facility] ANNUAL WELLNESS VISIT 1 [facility] [place]    Lab/Order associations:   ICD-10-CM   1. Arthralgia, unspecified joint  M25.50 Ambulatory referral to Rheumatology    Protein Electrophoresis, (serum)    Rheumatoid Factor    Antinuclear Antib (ANA)    Anti-CCP Ab, IgG + IgA (RDL)    2. Myalgia  M79.10 Ambulatory referral to Rheumatology    Protein Electrophoresis, (serum)    Rheumatoid Factor    Antinuclear Antib (ANA)    Anti-CCP Ab, IgG + IgA (RDL)    3. Acute pain of both shoulders  M25.511 Ambulatory referral to Rheumatology   M25.512 Protein Electrophoresis, (serum)    Rheumatoid Factor    Antinuclear Antib (ANA)    Anti-CCP Ab, IgG + IgA (RDL)    4. Morning stiffness of joints  M25.60 Ambulatory referral to Rheumatology    Protein Electrophoresis, (serum)    Rheumatoid Factor    Antinuclear Antib (ANA)    Anti-CCP Ab, IgG + IgA (RDL)    5. Palpitations  R00.2 ECHOCARDIOGRAM COMPLETE      No orders of the defined types were placed in this  encounter.   Return precautions advised.  Garnette Katrinka, MD

## 2024-01-31 ENCOUNTER — Other Ambulatory Visit (INDEPENDENT_AMBULATORY_CARE_PROVIDER_SITE_OTHER)

## 2024-01-31 DIAGNOSIS — M2550 Pain in unspecified joint: Secondary | ICD-10-CM

## 2024-01-31 DIAGNOSIS — M791 Myalgia, unspecified site: Secondary | ICD-10-CM

## 2024-01-31 DIAGNOSIS — R002 Palpitations: Secondary | ICD-10-CM

## 2024-01-31 DIAGNOSIS — M25512 Pain in left shoulder: Secondary | ICD-10-CM

## 2024-01-31 DIAGNOSIS — M2560 Stiffness of unspecified joint, not elsewhere classified: Secondary | ICD-10-CM

## 2024-01-31 DIAGNOSIS — M25511 Pain in right shoulder: Secondary | ICD-10-CM | POA: Diagnosis not present

## 2024-02-01 ENCOUNTER — Ambulatory Visit: Payer: Self-pay | Admitting: Family Medicine

## 2024-02-01 LAB — PROTEIN ELECTROPHORESIS, SERUM
A/G Ratio: 1.3 (ref 0.7–1.7)
Albumin ELP: 3.5 g/dL (ref 2.9–4.4)
Alpha 1: 0.2 g/dL (ref 0.0–0.4)
Alpha 2: 0.7 g/dL (ref 0.4–1.0)
Beta: 0.9 g/dL (ref 0.7–1.3)
Gamma Globulin: 0.9 g/dL (ref 0.4–1.8)
Globulin, Total: 2.7 g/dL (ref 2.2–3.9)
Total Protein: 6.2 g/dL (ref 6.0–8.5)

## 2024-02-01 LAB — RHEUMATOID FACTOR: Rheumatoid fact SerPl-aCnc: <10 [IU]/mL (ref <14.0)

## 2024-02-01 LAB — ANA: Anti Nuclear Antibody (ANA): NEGATIVE

## 2024-02-03 LAB — ANTI-CCP AB, IGG + IGA (RDL): Anti-CCP Ab, IgG + IgA (RDL): <20 U (ref <20)

## 2024-02-06 DIAGNOSIS — M47816 Spondylosis without myelopathy or radiculopathy, lumbar region: Secondary | ICD-10-CM | POA: Diagnosis not present

## 2024-02-06 DIAGNOSIS — M5412 Radiculopathy, cervical region: Secondary | ICD-10-CM | POA: Diagnosis not present

## 2024-02-07 ENCOUNTER — Other Ambulatory Visit: Payer: Self-pay | Admitting: Internal Medicine

## 2024-02-08 MED ORDER — OMEPRAZOLE 20 MG PO CPDR: 20.0000 mg | DELAYED_RELEASE_CAPSULE | Freq: Every day | ORAL | 3 refills | Status: DC

## 2024-02-13 ENCOUNTER — Ambulatory Visit (INDEPENDENT_AMBULATORY_CARE_PROVIDER_SITE_OTHER): Admitting: Family Medicine

## 2024-02-13 ENCOUNTER — Other Ambulatory Visit: Payer: Self-pay

## 2024-02-13 ENCOUNTER — Encounter: Payer: Self-pay | Admitting: Family Medicine

## 2024-02-13 VITALS — BP 128/68 | HR 81 | Temp 97.6°F | Ht 66.0 in | Wt 158.4 lb

## 2024-02-13 DIAGNOSIS — I2510 Atherosclerotic heart disease of native coronary artery without angina pectoris: Secondary | ICD-10-CM

## 2024-02-13 DIAGNOSIS — E785 Hyperlipidemia, unspecified: Secondary | ICD-10-CM | POA: Diagnosis not present

## 2024-02-13 DIAGNOSIS — E7841 Elevated Lipoprotein(a): Secondary | ICD-10-CM

## 2024-02-13 DIAGNOSIS — M2550 Pain in unspecified joint: Secondary | ICD-10-CM | POA: Diagnosis not present

## 2024-02-13 MED ORDER — OMEPRAZOLE 20 MG PO CPDR: 20.0000 mg | DELAYED_RELEASE_CAPSULE | Freq: Every day | ORAL | 3 refills | Status: DC

## 2024-02-13 MED ORDER — REPATHA SURECLICK 140 MG/ML ~~LOC~~ SOAJ: 140.0000 mg | SUBCUTANEOUS | 2 refills | Status: DC

## 2024-02-13 MED ORDER — METOPROLOL SUCCINATE ER 25 MG PO TB24: 12.5000 mg | ORAL_TABLET | Freq: Every day | ORAL | 3 refills | Status: DC

## 2024-02-13 NOTE — Telephone Encounter (Signed)
 Please resend to new pharmacy.

## 2024-02-13 NOTE — Progress Notes (Signed)
 Phone [phone] In person visit   Subjective:   Brett Ross is a 78 y.o. 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 Swallow whole. 90 tablet 3   cetirizine (ZYRTEC) 10 MG tablet Take 10 mg by mouth at bedtime.     clobetasol  (TEMOVATE ) 0.05 % external solution      Evolocumab  (REPATHA  SURECLICK) 140 MG/ML SOAJ Inject 140 mg into the skin every 14 (fourteen) days. 2 mL 2   famotidine (PEPCID) 20 MG tablet Take by mouth at bedtime.     metoprolol  succinate (TOPROL -XL) 25 MG 24 hr tablet Take 0.5 tablets (12.5 mg total) by mouth daily. 45 tablet 3   Multiple Vitamins-Minerals (CENTRUM SILVER PO) Take by mouth daily.     nitroGLYCERIN  (NITROSTAT ) 0.4 MG SL tablet Place 1 tablet (0.4 mg total) under the tongue every 5 (five) minutes as needed for chest pain. 25 tablet 3   Omega-3 Fatty Acids (SALMON OIL-1000) 200 MG CAPS Take 2,000 mg by mouth daily.     omeprazole  (PRILOSEC) 20 MG capsule Take 1 capsule (20 mg total) by mouth daily. 90 capsule 3   traMADol  (ULTRAM ) 50 MG tablet Take 1 tablet (50 mg total) by mouth every 6 (six) hours as needed for moderate pain (pain score 4-6). 30 tablet 0   No current  facility-administered medications for this visit.     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 In last week,  Significant improvement with prednisone , suggesting possible PMR despite atypical lab results or other autoimmune condition. Differential includes seronegative autoimmune conditions and statin-induced necrotizing autoimmune myopathy (but doubt). Vitamin D  deficiency noted but unlikely sole cause. Prednisone  reduced pain from 9/10 to 1-2/10. - Continue prednisone  taper, reducing dose to half tablet every other day for last two pill which will last close to 20 days with original 10 tablets of 20 mg - Order autoimmune labs including ANA, rheumatoid factor, anti-CCP. - Refer to rheumatology, urgent referral  to Dr. Curt at [facility] for his expertise. Also considered neurology- muscle biopsy? -Labs came back normal - Today he reports he finished last prednisone  on Saturday- pain still about 1-2/10 with morning stiffness still though  -Scheduled with rheumatology next week- Dr. Curt -Orthopedic previously reported arthritis in cervical, lumbar, shoulder and hand regions-steroid injections have been helpful at least 2 degree A/P: response to prednisone  being so profound still makes me wonder about PMR even with no inflammatory marker elevations or  perhaps diffuse arthritis simply repsonded well to this- id like for him to keep rheumatology visit for their opinion but thrilled he's better     # Cervical radiculopathy with right arm and hand pain-at last visit reported substantial improvement on steroids.  He was planning to see Dr.Ibazebo soon to consider epidural block but with improvement decided to hold off. He did see and with multilevel disease recommended traction and physical therapy basically- plans of the work on this with physical therapy    # Palpitations-had noted increased frequency and we ordered echocardiogram. Seems to have improved from last visit- for some reason echocardiogram says ordered and performed but he was never seen. Reports doing better though- may have been high dose prednisone - we opted to simply see Dr. Delford next week   # Prior elevated blood pressure-trended back down on repeat today-does take metoprolol  for his CAD but not for hypertension  #CAD-follows with Dr. Alveta -cath in [place]  Dr. Lorrene Lais 04/18/20 with stent #hyperlipidemia S: Medication:Aspirin  81 mg, metoprolol  12.5 mg extended release daily (also helps with history of palpitations), fish oil. Still doing Repatha  140 mg every 2 weeks with elevated LPA -statin myalgia Lab Results  Component Value Date   CHOL 138 03/18/2023   HDL 69 03/18/2023   LDLCALC 52 03/18/2023   TRIG 88 03/18/2023   CHOLHDL 2.0 03/18/2023   A/P: CAD asymptomatic continue current medications - prior fatigue has improved so do not think that was a cardiac issue interest he end.  -lipids at goal- continue current medications . Recheck next visit   Recommended follow up: Return for next already scheduled visit or sooner if needed. Future Appointments  Date Time Provider Department Center  03/19/2024  8:45 AM Delford Maude BROCKS, MD [facility]  06/05/2024 11:00 AM Katrinka Garnette KIDD, MD [facility]  08/07/2024  1:00 PM [facility] ANNUAL WELLNESS VISIT 1 [facility]  [place]    Lab/Order associations:   ICD-10-CM   1. Arthralgia, unspecified joint  M25.50     2. Coronary artery disease involving native coronary artery of native heart without angina pectoris  I25.10     3. Hyperlipidemia, unspecified hyperlipidemia type  E78.5       Return precautions advised.  Garnette Katrinka, MD

## 2024-02-13 NOTE — Patient Instructions (Addendum)
 Thrilled you are doing better and are plugged in with rheumatology  Recommended follow up: Return for next already scheduled visit or sooner if needed.

## 2024-02-14 ENCOUNTER — Other Ambulatory Visit: Payer: Self-pay | Admitting: Internal Medicine

## 2024-02-14 DIAGNOSIS — I2510 Atherosclerotic heart disease of native coronary artery without angina pectoris: Secondary | ICD-10-CM

## 2024-02-14 DIAGNOSIS — E7841 Elevated Lipoprotein(a): Secondary | ICD-10-CM

## 2024-02-14 DIAGNOSIS — E785 Hyperlipidemia, unspecified: Secondary | ICD-10-CM

## 2024-02-22 DIAGNOSIS — M2550 Pain in unspecified joint: Secondary | ICD-10-CM | POA: Diagnosis not present

## 2024-02-22 DIAGNOSIS — M791 Myalgia, unspecified site: Secondary | ICD-10-CM | POA: Diagnosis not present

## 2024-02-22 DIAGNOSIS — M2560 Stiffness of unspecified joint, not elsewhere classified: Secondary | ICD-10-CM | POA: Diagnosis not present

## 2024-02-22 DIAGNOSIS — M47812 Spondylosis without myelopathy or radiculopathy, cervical region: Secondary | ICD-10-CM | POA: Diagnosis not present

## 2024-02-22 DIAGNOSIS — M353 Polymyalgia rheumatica: Secondary | ICD-10-CM | POA: Diagnosis not present

## 2024-02-24 DIAGNOSIS — M47812 Spondylosis without myelopathy or radiculopathy, cervical region: Secondary | ICD-10-CM | POA: Diagnosis not present

## 2024-02-27 ENCOUNTER — Other Ambulatory Visit: Payer: Self-pay | Admitting: Internal Medicine

## 2024-02-27 DIAGNOSIS — E7841 Elevated Lipoprotein(a): Secondary | ICD-10-CM

## 2024-02-27 DIAGNOSIS — E785 Hyperlipidemia, unspecified: Secondary | ICD-10-CM

## 2024-02-27 DIAGNOSIS — I2510 Atherosclerotic heart disease of native coronary artery without angina pectoris: Secondary | ICD-10-CM

## 2024-02-27 NOTE — Telephone Encounter (Signed)
*  STAT* If patient is at the pharmacy, call can be transferred to refill team.   1. Which medications need to be refilled? (please list name of each medication and dose if known) Evolocumab  (REPATHA  SURECLICK) 140 MG/ML SOAJ    2. Would you like to learn more about the convenience, safety, & potential cost savings by using the [facility]?     3. Are you open to using the [facility] (Type [facility].  ).   4. Which pharmacy/location (including street and city if local pharmacy) is medication to be sent to? [email or web address] - [facility] - [place] - [address]    5. Do they need a 30 day or 90 day supply? 90 day  Change in pharmacy

## 2024-02-28 DIAGNOSIS — M47812 Spondylosis without myelopathy or radiculopathy, cervical region: Secondary | ICD-10-CM | POA: Diagnosis not present

## 2024-03-01 DIAGNOSIS — M47812 Spondylosis without myelopathy or radiculopathy, cervical region: Secondary | ICD-10-CM | POA: Diagnosis not present

## 2024-03-06 DIAGNOSIS — M47816 Spondylosis without myelopathy or radiculopathy, lumbar region: Secondary | ICD-10-CM | POA: Diagnosis not present

## 2024-03-06 DIAGNOSIS — M5412 Radiculopathy, cervical region: Secondary | ICD-10-CM | POA: Diagnosis not present

## 2024-03-06 DIAGNOSIS — M47812 Spondylosis without myelopathy or radiculopathy, cervical region: Secondary | ICD-10-CM | POA: Diagnosis not present

## 2024-03-13 DIAGNOSIS — M47812 Spondylosis without myelopathy or radiculopathy, cervical region: Secondary | ICD-10-CM | POA: Diagnosis not present

## 2024-03-13 NOTE — Progress Notes (Signed)
 CARDIOLOGY CONSULT NOTE       Patient ID: Brett Ross MRN: [id number] DOB/AGE: [birth year] 78 y.o.  Referring Physician: Katrinka Primary Physician: Katrinka Garnette KIDD, MD Primary Cardiologist: Nahser/Hilty new to me  Reason for Consultation: Chest pain / HLD   HPI:  78 y.o. previously followed by Dr Alveta. I see his wife who is chronically ill with MS and in wheelchair. Two brothers/mother with premature CAD. Had stent to the RCA in 2021 in [place] . Intolerant to statins and zetia . Seen by Dr Mona in lipid clinic started on repatha  02/14/24. On ASA and Toprol  as well. Needs updated labs but LDL was 52 03/18/23 and LpA  very high 215 09/09/23   Had normal stress test 2023 in [place]   Thinking of moving to [place] where his wife's son is located. He needs help with her as she has MS in wheel chair and dementia.   ROS All other systems reviewed and negative except as noted above  Past Medical History:  Diagnosis Date   Allergy    Arthritis    CAD (coronary artery disease) 2021   Cataract    bilateral cateracts removed with implants bilateraly   Detached retina    L 04/2014  Right 1990   Environmental allergies    GERD (gastroesophageal reflux disease)    HYPERLIPIDEMIA 02/06/2007   Macular pigment epithelial tear    bilateral eyes    Family History  Problem Relation Age of Onset   Heart failure Mother        CHF and heart disease; had breast cancer in 16s   Breast cancer Mother    Leukemia Father    Melanoma Brother    Heart disease Brother 70       mi, stent   Heart disease Brother 3       MI-required stent   Colon cancer Neg Hx    Rectal cancer Neg Hx    Stomach cancer Neg Hx    Esophageal cancer Neg Hx     Social History   Socioeconomic History   Marital status: Married    Spouse name: Not on file   Number of children: Not on file   Years of education: Not on file   Highest education level: Not on file  Occupational History   Not on file   Tobacco Use   Smoking status: Never   Smokeless tobacco: Never  Vaping Use   Vaping status: Never Used  Substance and Sexual Activity   Alcohol use: Yes    Alcohol/week: 7.0 standard drinks of alcohol    Types: 7 Cans of beer per week   Drug use: No   Sexual activity: Not on file  Other Topics Concern   Not on file  Social History Narrative   Married and has a step son.       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      Enjoys family time   Social Drivers of Health   Financial Resource Strain: Low Risk  (07/28/2023)   Overall Financial Resource Strain (CARDIA)    Difficulty of Paying Living Expenses: Not hard at all  Food Insecurity: No Food Insecurity (07/28/2023)   Hunger Vital Sign    Worried About Running Out of Food in the Last Year: Never true    Ran Out of Food in the Last  Year: Never true  Transportation Needs: No Transportation Needs (07/28/2023)   PRAPARE - Administrator, Civil Service (Medical): No    Lack of Transportation (Non-Medical): No  Physical Activity: Insufficiently Active (07/28/2023)   Exercise Vital Sign    Days of Exercise per Week: 1 day    Minutes of Exercise per Session: 20 min  Stress: No Stress Concern Present (07/28/2023)   [employer] of Occupational Health - Occupational Stress Questionnaire    Feeling of Stress : Only a little  Social Connections: Moderately Isolated (07/28/2023)   Social Connection and Isolation Panel    Frequency of Communication with Friends and Family: More than three times a week    Frequency of Social Gatherings with Friends and Family: More than three times a week    Attends Religious Services: Never    Database Administrator or Organizations: No    Attends Banker Meetings: Never    Marital Status: Married  Catering Manager Violence: Not At Risk (07/28/2023)    Humiliation, Afraid, Rape, and Kick questionnaire    Fear of Current or Ex-Partner: No    Emotionally Abused: No    Physically Abused: No    Sexually Abused: No    Past Surgical History:  Procedure Laterality Date   cataract surgery     01/09/15 left. right 2018.    COLONOSCOPY     CORONARY ANGIOPLASTY WITH STENT PLACEMENT  04/2020   INGUINAL HERNIA REPAIR     bilateral   KNEE ARTHROSCOPY  2007   left   POLYPECTOMY     VITRECTOMY     06/2014 left   VITRECTOMY     10/2017      Current Outpatient Medications:    aspirin  EC 81 MG tablet, Take 1 tablet (81 mg total) by mouth daily. Swallow whole., Disp: 90 tablet, Rfl: 3   cetirizine (ZYRTEC) 10 MG tablet, Take 10 mg by mouth at bedtime., Disp: , Rfl:    clobetasol  (TEMOVATE ) 0.05 % external solution, , Disp: , Rfl:    Evolocumab  (REPATHA  SURECLICK) 140 MG/ML SOAJ, INJECT 140 MG INTO THE SKIN EVERY 14 (FOURTEEN) DAYS., Disp: 6 mL, Rfl: 3   famotidine (PEPCID) 20 MG tablet, Take by mouth at bedtime., Disp: , Rfl:    metoprolol  succinate (TOPROL -XL) 25 MG 24 hr tablet, Take 0.5 tablets (12.5 mg total) by mouth daily., Disp: 45 tablet, Rfl: 3   Multiple Vitamins-Minerals (CENTRUM SILVER PO), Take by mouth daily., Disp: , Rfl:    nitroGLYCERIN  (NITROSTAT ) 0.4 MG SL tablet, Place 1 tablet (0.4 mg total) under the tongue every 5 (five) minutes as needed for chest pain., Disp: 25 tablet, Rfl: 3   Omega-3 Fatty Acids (SALMON OIL-1000) 200 MG CAPS, Take 2,000 mg by mouth daily., Disp: , Rfl:    omeprazole  (PRILOSEC) 20 MG capsule, Take 1 capsule (20 mg total) by mouth daily., Disp: 90 capsule, Rfl: 3   traMADol  (ULTRAM ) 50 MG tablet, Take 1 tablet (50 mg total) by mouth every 6 (six) hours as needed for moderate pain (pain score 4-6)., Disp: 30 tablet, Rfl: 0    Physical Exam: Blood pressure 120/76, pulse 63, resp. rate 16, height 5' 6 (1.676 m), weight 159 lb (72.1 kg), SpO2 97%.    Affect appropriate Healthy:  appears stated age HEENT:  normal Neck supple with no adenopathy JVP normal no bruits no thyromegaly Lungs clear with no wheezing and good diaphragmatic motion Heart:  S1/S2 no murmur, no rub,  gallop or click PMI normal Abdomen: benighn, BS positve, no tenderness, no AAA no bruit.  No HSM or HJR prior bilateral inguinal hernia repair  Distal pulses intact with no bruits No edema Neuro non-focal Skin warm and dry No muscular weakness   Labs:   Lab Results  Component Value Date   WBC 8.8 01/25/2024   HGB 14.8 01/25/2024   HCT 43.0 01/25/2024   MCV 91.2 01/25/2024   PLT 306.0 01/25/2024   No results for input(s): NA, K, CL, CO2, BUN, CREATININE, CALCIUM , PROT, BILITOT, ALKPHOS, ALT, AST, GLUCOSE in the last 168 hours.  Invalid input(s): LABALBU Lab Results  Component Value Date   CKTOTAL 46 01/25/2024    Lab Results  Component Value Date   CHOL 138 03/18/2023   CHOL 164 12/03/2022   CHOL 143 02/15/2022   Lab Results  Component Value Date   HDL 69 03/18/2023   HDL 64 12/03/2022   HDL 59.80 02/15/2022   Lab Results  Component Value Date   LDLCALC 52 03/18/2023   LDLCALC 71 12/03/2022   LDLCALC 52 02/15/2022   Lab Results  Component Value Date   TRIG 88 03/18/2023   TRIG 171 (H) 12/03/2022   TRIG 157.0 (H) 02/15/2022   Lab Results  Component Value Date   CHOLHDL 2.0 03/18/2023   CHOLHDL 2.6 12/03/2022   CHOLHDL 2 02/15/2022   No results found for: LDLDIRECT    Radiology: No results found.  EKG: SR rate 59 normal 12/03/22   ASSESSMENT AND PLAN:   CAD: stent to RCA 2021. No angina. Continue ASA, beta blocker Normal stresst testing in [place]  2023 HLD:  continue repatha  LDL at goal GERD:  on prilosec low carb diet    Lipid/Liver   F/U in a year   Signed: Maude Emmer 03/19/2024, 8:48 AM

## 2024-03-14 DIAGNOSIS — M47816 Spondylosis without myelopathy or radiculopathy, lumbar region: Secondary | ICD-10-CM | POA: Diagnosis not present

## 2024-03-15 DIAGNOSIS — M47812 Spondylosis without myelopathy or radiculopathy, cervical region: Secondary | ICD-10-CM | POA: Diagnosis not present

## 2024-03-19 ENCOUNTER — Ambulatory Visit: Attending: Cardiovascular Disease | Admitting: Cardiovascular Disease

## 2024-03-19 ENCOUNTER — Encounter: Payer: Self-pay | Admitting: Cardiovascular Disease

## 2024-03-19 VITALS — BP 120/76 | HR 63 | Resp 16 | Ht 66.0 in | Wt 159.0 lb

## 2024-03-19 DIAGNOSIS — I2510 Atherosclerotic heart disease of native coronary artery without angina pectoris: Secondary | ICD-10-CM | POA: Diagnosis not present

## 2024-03-19 DIAGNOSIS — E785 Hyperlipidemia, unspecified: Secondary | ICD-10-CM | POA: Diagnosis not present

## 2024-03-19 DIAGNOSIS — Z79899 Other long term (current) drug therapy: Secondary | ICD-10-CM | POA: Diagnosis not present

## 2024-03-19 NOTE — Patient Instructions (Addendum)
 Medication Instructions:  Your physician recommends that you continue on your current medications as directed. Please refer to the Current Medication list given to you today.  *If you need a refill on your cardiac medications before your next appointment, please call your pharmacy*  Lab Work: TODAY: LIPID, Liver panel If you have labs (blood work) drawn today and your tests are completely normal, you will receive your results only by: MyChart Message (if you have MyChart) OR A paper copy in the mail If you have any lab test that is abnormal or we need to change your treatment, we will call you to review the results.  Testing/Procedures: NONE  Follow-Up: At [facility], you and your health needs are our priority.  As part of our continuing mission to provide you with exceptional heart care, our providers are all part of one team.  This team includes your primary Cardiologist (physician) and Advanced Practice Providers or APPs (Physician Assistants and Nurse Practitioners) who all work together to provide you with the care you need, when you need it.  Your next appointment:   6 months  Provider:   Dr Delford  We recommend signing up for the patient portal called MyChart.  Sign up information is provided on this After Visit Summary.  MyChart is used to connect with patients for Virtual Visits (Telemedicine).  Patients are able to view lab/test results, encounter notes, upcoming appointments, etc.  Non-urgent messages can be sent to your provider as well.   To learn more about what you can do with MyChart, go to [email or web address].   Other Instructions   GERD in Adults: Diet Changes When you have gastroesophageal reflux disease (GERD), you may need to make changes to your diet. Choosing the right foods can help with your symptoms. Think about working with an expert in healthy eating called a dietitian. They can help you make healthy food choices. What are tips for  following this plan? Reading food labels Look for foods that are low in saturated fat. Foods that may help with your symptoms include: Foods with less than 5% of daily value (DV) of fat. Foods with 0 grams of trans fat. Cooking [employer] in ways that don't use a lot of fat. These ways include: Baking. Steaming. Grilling. Broiling. To add flavor, try to use herbs that are low in spice and acidity. Avoid frying your food. Meal planning  Eat small meals often rather than eating 3 large meals each day. Eat your meals slowly in a place where you feel relaxed. If told by your health care provider, avoid: Foods that cause symptoms. Keep a food diary to keep track of foods that cause symptoms. Alcohol. Drinking a lot of liquid with meals. General instructions For 2-3 hours after you eat, avoid: Bending over. Exercise. Lying down. Chew sugar-free gum after meals. What foods should I eat? Eat a healthy diet. Try to include: Foods with high amounts of fiber. These include: Fruits and vegetables. Whole grains and beans. Low-fat dairy products. Lean meats, fish, and poultry. Egg whites. Foods that cause symptoms in someone else may not cause symptoms for you. Work with your provider to find foods that are safe for you. The items listed above may not be all the foods and drinks you can have. Talk with a dietitian to learn more. The items listed above may not be a complete list of foods and beverages you can eat and drink. Contact a dietitian for more information. What foods should I  avoid? Limiting some of these foods may help with your symptoms. Each person is different. Talk with a dietitian or your provider to help you find the exact foods to avoid. Some of the foods to avoid may include: Fruits Fruits with a lot of acid in them. These may include citrus fruits, such as oranges, grapefruit, pineapple, and lemons. Vegetables Deep-fried vegetables, such as French  fries. Vegetables, sauces, or toppings made with added fat and vegetables with acid in them. These may include tomatoes and tomato products, chili peppers, onions, garlic, and horseradish. Grains Pastries or quick breads with added fat. Meats and other proteins High-fat meats, such as fatty beef or pork, hot dogs, ribs, ham, sausage, salami, and bacon. Fried meat or protein, such as fried fish and fried chicken. Egg yolks. Fats and oils Butter. Margarine. Shortening. Ghee. Drinks Coffee and other drinks with caffeine in them. Fizzy and sugary drinks, such as soda and energy drinks. Fruit juice made with acidic fruits, such as orange or grapefruit. Tomato juice. Sweets and desserts Chocolate and cocoa. Donuts. Seasonings and condiments Mint, such as peppermint and spearmint. Condiments, herbs, or seasonings that cause symptoms. These may include curry, hot sauce, or vinegar-based salad dressings. The items listed above may not be all the foods and drinks you should avoid. Talk with a dietitian to learn more. Questions to ask your health care provider Changes to your diet and everyday life are often the first steps taken to manage symptoms of GERD. If these changes don't help, talk with your provider about taking medicines. Where to find more information International Foundation for Gastrointestinal Disorders: aboutgerd.org This information is not intended to replace advice given to you by your health care provider. Make sure you discuss any questions you have with your health care provider. Document Revised: 03/01/2023 Document Reviewed: 09/15/2022 Elsevier Patient Education  2024 [employer].

## 2024-03-20 ENCOUNTER — Ambulatory Visit: Payer: Self-pay | Admitting: Cardiovascular Disease

## 2024-03-20 LAB — NMR, LIPOPROFILE
Cholesterol, Total: 126 mg/dL (ref 100–199)
HDL Particle Number: 38.9 umol/L (ref >=30.5)
HDL-C: 61 mg/dL (ref >39)
LDL Particle Number: 378 nmol/L (ref <1000)
LDL Size: 20.7 nm (ref >20.5)
LDL-C (NIH Calc): 46 mg/dL (ref 0–99)
LP-IR Score: 46 — ABNORMAL HIGH (ref <=45)
Small LDL Particle Number: 229 nmol/L (ref <=527)
Triglycerides: 106 mg/dL (ref 0–149)

## 2024-03-20 LAB — HEPATIC FUNCTION PANEL
ALT: 13 IU/L (ref 0–44)
AST: 18 IU/L (ref 0–40)
Albumin: 4.4 g/dL (ref 3.8–4.8)
Alkaline Phosphatase: 54 IU/L (ref 47–123)
Bilirubin Total: 0.5 mg/dL (ref 0.0–1.2)
Bilirubin, Direct: 0.2 mg/dL (ref 0.00–0.40)
Total Protein: 6.5 g/dL (ref 6.0–8.5)

## 2024-03-20 LAB — LIPID PANEL
Chol/HDL Ratio: 1.9 ratio (ref 0.0–5.0)
Cholesterol, Total: 121 mg/dL (ref 100–199)
HDL: 63 mg/dL (ref >39)
LDL Chol Calc (NIH): 40 mg/dL (ref 0–99)
Triglycerides: 98 mg/dL (ref 0–149)
VLDL Cholesterol Cal: 18 mg/dL (ref 5–40)

## 2024-03-21 ENCOUNTER — Ambulatory Visit: Payer: Self-pay | Admitting: Internal Medicine

## 2024-03-21 LAB — LIPOPROTEIN A (LPA): Lipoprotein (a): 116.1 nmol/L — ABNORMAL HIGH (ref <75.0)

## 2024-06-05 ENCOUNTER — Ambulatory Visit: Payer: Medicare Other | Admitting: Family Medicine

## 2024-06-05 ENCOUNTER — Ambulatory Visit: Payer: Self-pay | Admitting: Family Medicine

## 2024-06-05 ENCOUNTER — Encounter: Payer: Self-pay | Admitting: Family Medicine

## 2024-06-05 VITALS — BP 118/68 | HR 70 | Temp 97.8°F | Ht 66.0 in | Wt 158.2 lb

## 2024-06-05 DIAGNOSIS — E7841 Elevated Lipoprotein(a): Secondary | ICD-10-CM | POA: Diagnosis not present

## 2024-06-05 DIAGNOSIS — I2510 Atherosclerotic heart disease of native coronary artery without angina pectoris: Secondary | ICD-10-CM | POA: Diagnosis not present

## 2024-06-05 DIAGNOSIS — G720 Drug-induced myopathy: Secondary | ICD-10-CM | POA: Diagnosis not present

## 2024-06-05 DIAGNOSIS — E559 Vitamin D deficiency, unspecified: Secondary | ICD-10-CM | POA: Diagnosis not present

## 2024-06-05 DIAGNOSIS — Z0000 Encounter for general adult medical examination without abnormal findings: Secondary | ICD-10-CM

## 2024-06-05 DIAGNOSIS — Z131 Encounter for screening for diabetes mellitus: Secondary | ICD-10-CM | POA: Diagnosis not present

## 2024-06-05 DIAGNOSIS — E785 Hyperlipidemia, unspecified: Secondary | ICD-10-CM | POA: Diagnosis not present

## 2024-06-05 DIAGNOSIS — E663 Overweight: Secondary | ICD-10-CM | POA: Diagnosis not present

## 2024-06-05 LAB — HEMOGLOBIN A1C: Hgb A1c MFr Bld: 5.3 % (ref 4.6–6.5)

## 2024-06-05 LAB — VITAMIN D 25 HYDROXY (VIT D DEFICIENCY, FRACTURES): VITD: 19.1 ng/mL — ABNORMAL LOW (ref 30.00–100.00)

## 2024-06-05 MED ORDER — NITROGLYCERIN 0.4 MG SL SUBL: 0.4000 mg | SUBLINGUAL_TABLET | SUBLINGUAL | 3 refills | Status: AC | PRN

## 2024-06-05 MED ORDER — METOPROLOL SUCCINATE ER 25 MG PO TB24: 12.5000 mg | ORAL_TABLET | Freq: Every day | ORAL | 3 refills | Status: AC

## 2024-06-05 MED ORDER — VITAMIN D (ERGOCALCIFEROL) 1.25 MG (50000 UNIT) PO CAPS: 50000.0000 [IU] | ORAL_CAPSULE | ORAL | 1 refills | Status: AC

## 2024-06-05 MED ORDER — OMEPRAZOLE 20 MG PO CPDR: 20.0000 mg | DELAYED_RELEASE_CAPSULE | Freq: Every day | ORAL | 3 refills | Status: AC

## 2024-06-05 NOTE — Patient Instructions (Addendum)
 Please stop by lab before you go If you have mychart- we will send your results within 3 business days of us  receiving them.  If you do not have mychart- we will call you about results within 5 business days of us  receiving them.  *please also note that you will see labs on mychart as soon as they post. I will later go in and write notes on them- will say notes from Dr. Katrinka   No changes today unless labs lead us  to make changes  Recommended follow up: Return in about 1 year (around 06/05/2025) for physical or sooner if needed.Schedule b4 you leave.

## 2024-06-07 ENCOUNTER — Ambulatory Visit: Payer: Medicare Other

## 2024-08-07 ENCOUNTER — Ambulatory Visit

## 2025-06-06 ENCOUNTER — Encounter: Admitting: Family Medicine
